# Patient Record
Sex: Female | Born: 1975
Health system: Southern US, Community
[De-identification: ages and names within clinical notes are randomized; demographics above are authoritative.]

## PROBLEM LIST (undated history)

## (undated) DIAGNOSIS — G4700 Insomnia, unspecified: Secondary | ICD-10-CM

## (undated) DIAGNOSIS — E282 Polycystic ovarian syndrome: Secondary | ICD-10-CM

## (undated) DIAGNOSIS — F419 Anxiety disorder, unspecified: Secondary | ICD-10-CM

## (undated) DIAGNOSIS — E559 Vitamin D deficiency, unspecified: Secondary | ICD-10-CM

## (undated) DIAGNOSIS — J309 Allergic rhinitis, unspecified: Secondary | ICD-10-CM

## (undated) DIAGNOSIS — R51 Headache: Secondary | ICD-10-CM

## (undated) DIAGNOSIS — J45909 Unspecified asthma, uncomplicated: Secondary | ICD-10-CM

## (undated) HISTORY — DX: Polycystic ovarian syndrome: E28.2

## (undated) HISTORY — DX: Allergic rhinitis, unspecified: J30.9

## (undated) HISTORY — DX: Vitamin D deficiency, unspecified: E55.9

## (undated) HISTORY — DX: Unspecified asthma, uncomplicated: J45.909

## (undated) HISTORY — DX: Anxiety disorder, unspecified: F41.9

## (undated) HISTORY — DX: Headache: R51

## (undated) HISTORY — DX: Insomnia, unspecified: G47.00

---

## 2010-04-27 ENCOUNTER — Ambulatory Visit: Payer: Self-pay | Admitting: Family Medicine

## 2010-04-27 DIAGNOSIS — R519 Headache, unspecified: Secondary | ICD-10-CM | POA: Insufficient documentation

## 2010-04-27 DIAGNOSIS — J309 Allergic rhinitis, unspecified: Secondary | ICD-10-CM | POA: Insufficient documentation

## 2010-04-27 DIAGNOSIS — G4700 Insomnia, unspecified: Secondary | ICD-10-CM

## 2010-04-27 DIAGNOSIS — R51 Headache: Secondary | ICD-10-CM

## 2010-04-27 DIAGNOSIS — J45909 Unspecified asthma, uncomplicated: Secondary | ICD-10-CM | POA: Insufficient documentation

## 2010-04-27 HISTORY — DX: Allergic rhinitis, unspecified: J30.9

## 2010-04-27 HISTORY — DX: Insomnia, unspecified: G47.00

## 2010-04-27 HISTORY — DX: Headache: R51

## 2010-10-09 ENCOUNTER — Ambulatory Visit
Admission: RE | Admit: 2010-10-09 | Discharge: 2010-10-09 | Payer: Self-pay | Source: Home / Self Care | Attending: Family Medicine | Admitting: Family Medicine

## 2010-10-09 ENCOUNTER — Other Ambulatory Visit: Payer: Self-pay | Admitting: Family Medicine

## 2010-10-09 LAB — CONVERTED CEMR LAB
Bilirubin Urine: NEGATIVE
Blood in Urine, dipstick: NEGATIVE
Glucose, Urine, Semiquant: NEGATIVE
Nitrite: NEGATIVE
Protein, U semiquant: NEGATIVE
Specific Gravity, Urine: 1.02
Urobilinogen, UA: 0.2
WBC Urine, dipstick: NEGATIVE
pH: 7

## 2010-10-09 LAB — CBC WITH DIFFERENTIAL/PLATELET
Basophils Absolute: 0 10*3/uL (ref 0.0–0.1)
Basophils Relative: 0.9 % (ref 0.0–3.0)
Eosinophils Absolute: 0.1 10*3/uL (ref 0.0–0.7)
Eosinophils Relative: 1.6 % (ref 0.0–5.0)
HCT: 35 % — ABNORMAL LOW (ref 36.0–46.0)
Hemoglobin: 12.2 g/dL (ref 12.0–15.0)
Lymphocytes Relative: 39.4 % (ref 12.0–46.0)
Lymphs Abs: 1.7 10*3/uL (ref 0.7–4.0)
MCHC: 34.7 g/dL (ref 30.0–36.0)
MCV: 89.4 fl (ref 78.0–100.0)
Monocytes Absolute: 0.3 10*3/uL (ref 0.1–1.0)
Monocytes Relative: 6.1 % (ref 3.0–12.0)
Neutro Abs: 2.3 10*3/uL (ref 1.4–7.7)
Neutrophils Relative %: 52 % (ref 43.0–77.0)
Platelets: 288 10*3/uL (ref 150.0–400.0)
RBC: 3.92 Mil/uL (ref 3.87–5.11)
RDW: 13.3 % (ref 11.5–14.6)
WBC: 4.3 10*3/uL — ABNORMAL LOW (ref 4.5–10.5)

## 2010-10-09 LAB — BASIC METABOLIC PANEL
BUN: 7 mg/dL (ref 6–23)
CO2: 28 mEq/L (ref 19–32)
Calcium: 9.1 mg/dL (ref 8.4–10.5)
Chloride: 106 mEq/L (ref 96–112)
Creatinine, Ser: 0.6 mg/dL (ref 0.4–1.2)
GFR: 111.09 mL/min (ref >60.00)
Glucose, Bld: 79 mg/dL (ref 70–99)
Potassium: 4.6 mEq/L (ref 3.5–5.1)
Sodium: 140 mEq/L (ref 135–145)

## 2010-10-09 LAB — LDL CHOLESTEROL, DIRECT: Direct LDL: 129.8 mg/dL

## 2010-10-09 LAB — LIPID PANEL
Cholesterol: 205 mg/dL — ABNORMAL HIGH (ref 0–200)
HDL: 67.1 mg/dL (ref >39.00)
Total CHOL/HDL Ratio: 3
Triglycerides: 41 mg/dL (ref 0.0–149.0)
VLDL: 8.2 mg/dL (ref 0.0–40.0)

## 2010-10-09 LAB — HEPATIC FUNCTION PANEL
ALT: 14 U/L (ref 0–35)
AST: 16 U/L (ref 0–37)
Albumin: 3.9 g/dL (ref 3.5–5.2)
Alkaline Phosphatase: 39 U/L (ref 39–117)
Bilirubin, Direct: 0.1 mg/dL (ref 0.0–0.3)
Total Bilirubin: 0.5 mg/dL (ref 0.3–1.2)
Total Protein: 6.4 g/dL (ref 6.0–8.3)

## 2010-10-09 LAB — TSH: TSH: 1.19 u[IU]/mL (ref 0.35–5.50)

## 2010-10-09 NOTE — Assessment & Plan Note (Signed)
Summary: NEW PT EST // RS   Vital Signs:  Patient profile:   35 year old female Menstrual status:  irregular LMP:     03/27/2010 Height:      66.25 inches Weight:      221 pounds BMI:     35.53 Temp:     99.0 degrees F oral Pulse rate:   88 / minute Pulse rhythm:   regular Resp:     12 per minute BP sitting:   100 / 68  (left arm) Cuff size:   large  Vitals Entered By: Sid Falcon LPN (April 27, 2010 3:15 PM)  Nutrition Counseling: Patient's BMI is greater than 25 and therefore counseled on weight management options. CC: New to establish LMP (date): 03/27/2010     Menstrual Status irregular Enter LMP: 03/27/2010   History of Present Illness: New patient to establish care. Has history of migraine headaches, mild intermittent asthma, seasonal allergies and obesity. Has previously been on phentermine but not taking currently. Long history of some intermittent insomnia. Takes alprazolam as needed. No prior surgeries. No known drug allergies.  Family history significant for hypertension, hyperlipidemia in both parents. He had an uncle with diabetes.  Patient works as area Production designer, theatre/television/film for [employer]. Ex-smoker. No alcohol. Married. No children.  Preventive Screening-Counseling & Management  Alcohol-Tobacco     Smoking Status: quit     Year Quit: 1991     Pack years: 1998  Allergies (verified): No Known Drug Allergies  Past History:  Family History: Last updated: 04/27/2010 Family History of Arthritis Mothers sister, breast CA > 50 Mom, hyperlipidemia, hypertension Dad, hyperlidemia, hypertension Both grandmothers, diabetes, type ll  Social History: Last updated: 04/27/2010 Occupation:  Social worker Married Alcohol use-no Past smoker, quit 2006  Risk Factors: Smoking Status: quit (04/27/2010)  Past Medical History: Asthma, mild intermittent Headache, Migraines Hay fever, allergies Chicken pox Chronic insomnia PMH-FH-SH reviewed for  relevance  Family History: Family History of Arthritis Mothers sister, breast CA > 50 Mom, hyperlipidemia, hypertension Dad, hyperlidemia, hypertension Both grandmothers, diabetes, type ll  Social History: Occupation:  Social worker Married Alcohol use-no Past smoker, quit 2006 Smoking Status:  quit Occupation:  employed  Review of Systems  The patient denies anorexia, fever, weight loss, weight gain, chest pain, syncope, dyspnea on exertion, peripheral edema, prolonged cough, headaches, hemoptysis, abdominal pain, melena, hematochezia, severe indigestion/heartburn, muscle weakness, and suspicious skin lesions.    Physical Exam  General:  Well-developed,well-nourished,in no acute distress; alert,appropriate and cooperative throughout examination Head:  Normocephalic and atraumatic without obvious abnormalities. No apparent alopecia or balding. Ears:  External ear exam shows no significant lesions or deformities.  Otoscopic examination reveals clear canals, tympanic membranes are intact bilaterally without bulging, retraction, inflammation or discharge. Hearing is grossly normal bilaterally. Mouth:  Oral mucosa and oropharynx without lesions or exudates.  Teeth in good repair. Neck:  No deformities, masses, or tenderness noted. Lungs:  Normal respiratory effort, chest expands symmetrically. Lungs are clear to auscultation, no crackles or wheezes. Heart:  Normal rate and regular rhythm. S1 and S2 normal without gallop, murmur, click, rub or other extra sounds. Extremities:  No clubbing, cyanosis, edema, or deformity noted with normal full range of motion of all joints.   Neurologic:  alert & oriented X3 and cranial nerves II-XII intact.   Cervical Nodes:  No lymphadenopathy noted Psych:  normally interactive, good eye contact, not anxious appearing, and not depressed appearing.     Impression & Recommendations:  Problem #  1:  ASTHMA (ICD-493.90) mild and intermittent by hx.  Uses  albuterol as needed   Problem # 2:  HEADACHE (ICD-784.0) hx of reported migraine, stable.  Problem # 3:  INSOMNIA, CHRONIC (ICD-307.42) use alprazolam sparingly.  Problem # 4:  ALLERGIC RHINITIS (ICD-477.9)  Problem # 5:  OBESITY (ICD-278.00) pt to schedule CPE and discuss further then.  Complete Medication List: 1)  Alprazolam 0.5 Mg Tabs (Alprazolam) .... 2 tabs as needed at bedtime  Patient Instructions: 1)  Schedule complete physical examination Prescriptions: ALPRAZOLAM 0.5 MG TABS (ALPRAZOLAM) 2 tabs as needed at bedtime  #60 x 1   Entered and Authorized by:   Evelena Peat MD   Signed by:   Evelena Peat MD on 04/27/2010   Method used:   Print then Give to Patient   RxID:   [id number]

## 2010-10-11 ENCOUNTER — Encounter: Payer: Self-pay | Admitting: Family Medicine

## 2010-10-12 ENCOUNTER — Ambulatory Visit (INDEPENDENT_AMBULATORY_CARE_PROVIDER_SITE_OTHER): Payer: BC Managed Care – HMO | Admitting: Family Medicine

## 2010-10-12 ENCOUNTER — Encounter: Payer: Self-pay | Admitting: Family Medicine

## 2010-10-12 VITALS — BP 120/78 | HR 72 | Temp 98.1°F | Resp 14 | Ht 66.0 in | Wt 225.0 lb

## 2010-10-12 DIAGNOSIS — Z23 Encounter for immunization: Secondary | ICD-10-CM

## 2010-10-12 DIAGNOSIS — Z0000 Encounter for general adult medical examination without abnormal findings: Secondary | ICD-10-CM

## 2010-10-12 NOTE — Progress Notes (Signed)
  Subjective:    Patient ID: Sarah Munoz, female    DOB: 04-Jan-1976, 35 y.o.   MRN: [id number]  HPI Patient presents for complete physical examination. She's been exercising with walking about 4 days per week or exercise cycle. Last tetanus unknown but she thinks about 10 years ago. Last Pap smear estimated one half years ago. She plans to set up see gynecologist later this year.  Past medical history, social history, and family history reviewed.  Patient struggled with weight gain through the years, especially after quitting smoking 2007. She's been on low-dose phentermine from another clinic.    Review of Systems  Constitutional: Negative for fever, activity change, appetite change and fatigue.  HENT: Negative for hearing loss, ear pain, sore throat and trouble swallowing.   Eyes: Negative for visual disturbance.  Respiratory: Negative for cough and shortness of breath.   Cardiovascular: Negative for chest pain and palpitations.  Gastrointestinal: Negative for abdominal pain, diarrhea, constipation and blood in stool.  Genitourinary: Negative for dysuria and hematuria.  Musculoskeletal: Negative for myalgias, back pain and arthralgias.  Skin: Negative for rash.  Neurological: Negative for dizziness, syncope and headaches.  Hematological: Negative for adenopathy.  Psychiatric/Behavioral: Negative for confusion and dysphoric mood.       Objective:   Physical Exam  Constitutional: She is oriented to person, place, and time. She appears well-developed and well-nourished. No distress.  HENT:  Head: Normocephalic and atraumatic.  Right Ear: External ear normal.  Left Ear: External ear normal.  Nose: Nose normal.  Mouth/Throat: Oropharynx is clear and moist. No oropharyngeal exudate.  Eyes: Conjunctivae and EOM are normal. Pupils are equal, round, and reactive to light.  Neck: Normal range of motion. Neck supple. No thyromegaly present.  Cardiovascular: Normal rate, regular  rhythm and normal heart sounds.  Exam reveals no gallop and no friction rub.   No murmur heard. Pulmonary/Chest: Effort normal and breath sounds normal. She has no wheezes. She has no rales.  Abdominal: Soft. Bowel sounds are normal. She exhibits no mass. There is no tenderness. There is no rebound and no guarding.  Musculoskeletal: Normal range of motion. She exhibits no edema.  Lymphadenopathy:    She has no cervical adenopathy.  Neurological: She is alert and oriented to person, place, and time. No cranial nerve deficit.  Skin: No rash noted. No erythema.  Psychiatric: She has a normal mood and affect.          Assessment & Plan:  Patient here for a wellness exam. Generally healthy. Minimally elevated cholesterol otherwise labs obtain Tdap given.  Discussed importance of exercise and weight loss. She will be scheduling Pap smear with gynecologist later this year She had questions about whether she should remain on oral contraception. Given her age and positive family history of DVT in mother we've recommended against.

## 2010-10-12 NOTE — Patient Instructions (Addendum)
Continue with regular exercise. Work on weight loss. Consider repeat pap smear within the next year. Your tetanus vaccine is good for ten years.Low-Fat, Low-Saturated-Fat, Low-Cholesterol Diets Food Selection Guide   BREADS, CEREALS, PASTA, RICE, DRIED PEAS, AND BEANS These products are high in carbohydrate, and most are low in fat. Therefore, they can be increased in the diet as substitutes for fatty foods. However, they, too, contain calories and should not be eaten in excess. Cereals can be eaten for snacks as well as for breakfast.    Include foods that contain fiber (fruits, vegetables, whole grains, and legumes). Research shows that fiber may lower blood cholesterol levels, especially the water-soluble fiber found in fruits, vegetables, oat products and legumes.   FRUITS AND VEGETABLES It is good to eat fruits and vegetables. Besides being sources of fiber, both are rich in vitamins and some minerals. They help you get the daily allowances of these nutrients. Fruits and vegetables can be used for snacks and desserts.   MEATS Limit how much lean meat, chicken, [place], and fish to no more than six ounces per day.   Beef, Pork, and Lamb  Use lean cuts of beef, pork, and lamb. Lean cuts include: l Extra-lean ground beef. l Arm roast. l Sirloin tip. l Center-cut ham. l Round steak. l Loin chops. l Rump roast. l Tenderloin.  Trim all fat off the outside of meats before cooking. It is not necessary to severely decrease the intake of red meat, but lean choices should be made. Lean meat is rich in protein, and contains a highly absorbable form of iron. Premenopausal women, in particular, should avoid severe reduction of lean red meat because this could increase the risk for low red blood cells (iron-deficiency anemia).   Processed Meats Processed meats such as bacon, bologna, salami, sausage and hot dogs contain large quantities of fat, are  not rich in valuable nutrients, and should  should not be eaten very often.   Organ Meats The organ meats such as liver, sweetbreads, kidneys, and brain are very rich in cholesterol. They should be limited.   Chicken and [place] These are good sources of protein. The fat of poultry can be reduced by removing the skin and underlying fat layers before cooking. Chicken and [place] can be substituted for lean red meat in the diet. Poultry should not be fried or covered with high-fat sauces.   Fish and Shellfish Fish are good sources of protein. Shellfish contain cholesterol, but they usually are low in saturated fatty acids. The preparation of fish is important. Like chicken and [place], they should not be fried or covered with fat-rich sauces.   EGGS Egg yolks often are hidden in cooked and processed foods. Egg whites contain no fat or cholesterol. They can be eaten often. Try one to two egg whites instead of whole eggs in recipes, or use egg substitutes that do not contain yolk.   MILK AND DAIRY PRODUCTS Use skim or 1% milk instead of 2% or whole milk. Decrease whole milk, natural and processed cheeses. Use nonfat or low-fat (2%) cottage cheese or low-fat cheeses made from vegetable oils. Choose the nonfat or low-fat (1-2%) yogurt. Experiment with evaporated skim milk in recipes that call for heavy cream. Substitute low-fat yogurt or low-fat cottage cheese for sour cream in dips and salad dressings. Have at least two servings of low-fat dairy products, such as two glasses of skim (or 1%) milk each day to help get your daily calcium intake.   FATS  AND OILS The general rule is to reduce the total intake of fats, especially saturated fat. Butter fat, lard and beef fats are high in saturated fat and cholesterol. These should be avoided as much as possible. Vegetable fats do not contain cholesterol, but certain vegetable fats such as coconut oil, palm oil, and palm kernel oil are very high in saturated fats. These should be limited. These fats are  often used in [employer], processed foods, popcorn, oils and non-dairy creamers. Vegetable shortenings and some peanut butters contain hydrogenated oils, which are also saturated fats. Read the labels on these foods to check for the use of saturated vegetable oils.   Unsaturated vegetable oils and fats do not raise blood cholesterol. However, they should be limited because they are fats and are high in calories. Total fat should still be limited to 30% of daily caloric intake. Desirable liquid vegetable oils are corn oil, cottonseed oil, olive oil, canola oil, safflower oil, soybean oil and sunflower oil. Peanut oil is not as good, but small amounts are acceptable. Buy a heart healthy tub margarine that has no partially hydrogenated oils in the ingredients. Mayonnaise and salad dressings often are made from unsaturated fats, but they should also be limited because of their high calorie and fat content.   Seeds, nuts, peanut butter, olives, and avocados are high in fat, but the fat is mainly the unsaturated type. These foods should be limited mainly to avoid excess calories and fat.   OTHER EATING TIPS Snacks  Most sweets should be limited as snacks. They tend to be rich in calories and fats, and their caloric content outweighs their nutritional value. Some good choices in snacks are graham crackers, Ry-Krisp crackers, melba toast, soda crackers, bagels (non-egg), English muffins, fruits and vegetables. These snacks are preferable to snack crackers, [place] fries and chips. Popcorn should be air-popped or cooked in small amounts of liquid vegetable oil.   Desserts Eat fruit, low-fat yogurt and fruit ices instead of pastries, cake and cookies. Also acceptable are sherbet, angel food cake, JelI~O, frozen low-fat yogurt, or other frozen products that do not contain saturated fat (such as pure fruit juice bars or popsicles).    COOKING METHODS Choose those methods that use little or no fat. They  include  Poaching.  Braising.  Steaming.  Grilling.  Baking.  Stir-frying.  Broiling.  Microwaving.   Foods can be cooked in a nonstick pan without added fat, or use a nonfat cooking spray in regular cookware. Limit fried foods and avoid frying in saturated fat. Add moisture to lean meats by using water, broth, cooking wines and other nonfat or low-fat sauces along with the cooking methods mentioned above.   Soups and stews should be chilled after cooking, and the fat that forms on top after a few hours in the refrigerator should be skimmed off. When preparing meals, avoid using excess salt. Salt can contribute to raising blood pressure in some people.   EATING AWAY FROM HOME Order entres, potatoes, and vegetables without sauces or butter. When meat exceeds the size of a deck of cards (three to four ounces), the rest can be taken home for another meal.   Choose vegetable or fruit salads, and ask for low-calorie salad dressings to be served on the side. Use dressings sparingly. Limit high-fat toppings such as bacon, crumbled eggs, cheese, sunflower seeds and olives. Ask for heart healthy tub margarine instead of butter.     Document Released: 02/15/2002  Document Re-Released: 09/17/2009  Exit[employer] Patient Information 2011 [place].

## 2010-10-29 ENCOUNTER — Encounter: Payer: Self-pay | Admitting: Family Medicine

## 2010-11-20 ENCOUNTER — Telehealth: Payer: Self-pay | Admitting: *Deleted

## 2010-11-20 MED ORDER — ALPRAZOLAM 0.5 MG PO TABS: ORAL_TABLET | ORAL | Status: DC

## 2010-11-20 NOTE — Telephone Encounter (Signed)
May modify to take 1-2 po qhs prn

## 2010-11-20 NOTE — Telephone Encounter (Signed)
Rx called in 

## 2010-11-20 NOTE — Telephone Encounter (Signed)
Faxed request to fill Alprazolam 0.5 mg tabs, sig is take 2 tabs at bedtime as needed.  Pt had CPX in Feb, 2012.  Our records have Alprazolam 0.5, one tab at HS as needed.

## 2011-05-03 ENCOUNTER — Ambulatory Visit (INDEPENDENT_AMBULATORY_CARE_PROVIDER_SITE_OTHER): Payer: BC Managed Care – HMO | Admitting: Family Medicine

## 2011-05-03 ENCOUNTER — Encounter: Payer: Self-pay | Admitting: Family Medicine

## 2011-05-03 VITALS — BP 104/70 | Temp 98.0°F | Wt 229.0 lb

## 2011-05-03 DIAGNOSIS — N63 Unspecified lump in unspecified breast: Secondary | ICD-10-CM

## 2011-05-03 DIAGNOSIS — N6320 Unspecified lump in the left breast, unspecified quadrant: Secondary | ICD-10-CM

## 2011-05-03 NOTE — Patient Instructions (Signed)
We will call you next week regarding breast studies. Continue to avoid caffeine.

## 2011-05-03 NOTE — Progress Notes (Signed)
  Subjective:    Patient ID: Sarah Munoz, female    DOB: Mar 14, 1976, 35 y.o.   MRN: [id number]  HPI Patient noted painful lump left breast around 11:00 position about 4 days ago. No injury. No nipple discharge. No history of fibrocystic changes. Generally avoids caffeine. Denies any adenopathy. No family history of breast cancer. Pain is relatively mild. No right breast symptoms. No change in size since onset. Last menstrual period about 2 weeks ago and normal. Denies headache.   Review of Systems  Constitutional: Negative for appetite change and unexpected weight change.  Skin: Negative for rash.  Hematological: Negative for adenopathy.       Objective:   Physical Exam  Constitutional: She appears well-developed and well-nourished. No distress.  Cardiovascular: Normal rate, regular rhythm and normal heart sounds.   Pulmonary/Chest: Effort normal and breath sounds normal. No respiratory distress. She has no wheezes. She has no rales.       Right breast is normal. No masses noted. Left breast reveals tender slightly mobile mass around 11:00 position upper medial quadrant. By palpation this is about 1 x 2 cm. Nipples appears normal. No skin changes. No axillary adenopathy          Assessment & Plan:  Painful left breast mass. Most likely this is benign but will proceed with diagnostic mammogram and ultrasound if indicated.

## 2011-05-06 ENCOUNTER — Other Ambulatory Visit: Payer: Self-pay | Admitting: Family Medicine

## 2011-05-06 DIAGNOSIS — N6320 Unspecified lump in the left breast, unspecified quadrant: Secondary | ICD-10-CM

## 2011-05-09 ENCOUNTER — Ambulatory Visit
Admission: RE | Admit: 2011-05-09 | Discharge: 2011-05-09 | Disposition: A | Discharge status: Home / Self Care | Payer: BC Managed Care – HMO | Source: Ambulatory Visit | Attending: Family Medicine | Admitting: Family Medicine

## 2011-05-09 DIAGNOSIS — N6320 Unspecified lump in the left breast, unspecified quadrant: Secondary | ICD-10-CM

## 2011-05-10 NOTE — Progress Notes (Signed)
Quick Note:  Pt informed on personally identified VM ______ 

## 2011-09-02 ENCOUNTER — Other Ambulatory Visit: Payer: Self-pay | Admitting: Family Medicine

## 2011-09-02 NOTE — Telephone Encounter (Signed)
Last filled on 11-20-10, #180 with 1 refill

## 2011-09-02 NOTE — Telephone Encounter (Signed)
Refill once 

## 2011-10-25 ENCOUNTER — Ambulatory Visit (INDEPENDENT_AMBULATORY_CARE_PROVIDER_SITE_OTHER): Payer: BC Managed Care – HMO | Admitting: Family Medicine

## 2011-10-25 ENCOUNTER — Encounter: Payer: Self-pay | Admitting: Family Medicine

## 2011-10-25 VITALS — BP 112/78 | Temp 98.1°F | Wt 250.0 lb

## 2011-10-25 DIAGNOSIS — J209 Acute bronchitis, unspecified: Secondary | ICD-10-CM

## 2011-10-25 MED ORDER — ALBUTEROL SULFATE HFA 108 (90 BASE) MCG/ACT IN AERS: 2.0000 | INHALATION_SPRAY | RESPIRATORY_TRACT | Status: DC | PRN

## 2011-10-25 MED ORDER — HYDROCOD POLST-CHLORPHEN POLST 10-8 MG/5ML PO LQCR: 5.0000 mL | Freq: Two times a day (BID) | ORAL | Status: DC | PRN

## 2011-10-25 NOTE — Patient Instructions (Signed)
Follow up immediately for any fever or worsening symptoms. 

## 2011-10-25 NOTE — Progress Notes (Signed)
  Subjective:    Patient ID: Sarah Munoz, female    DOB: 1975/12/14, 36 y.o.   MRN: [id number]  HPI  Acute visit. Onset of cough about one week ago. Cough is dry. She has history of asthma which is mild and intermittent. Uses albuterol as needed. Decreased sleep secondary to cough. Has some associated nasal congestion. No fever or chills. No dyspnea. Using over-the-counter NyQuil without any relief of cough. Denies sore throat.  Patient had intermittent headaches. Recently saw ophthalmologist. Question of pseudotumor cerebri and she is still undergoing workup   Review of Systems  Constitutional: Negative for fever and chills.  HENT: Positive for congestion.   Respiratory: Positive for cough. Negative for shortness of breath and wheezing.   Cardiovascular: Negative for chest pain.       Objective:   Physical Exam  Constitutional: She appears well-developed and well-nourished.  HENT:  Right Ear: External ear normal.  Left Ear: External ear normal.  Mouth/Throat: Oropharynx is clear and moist.  Neck: Neck supple. No thyromegaly present.  Cardiovascular: Normal rate and regular rhythm.   Pulmonary/Chest: Effort normal and breath sounds normal. No respiratory distress. She has no wheezes. She has no rales.  Lymphadenopathy:    She has no cervical adenopathy.          Assessment & Plan:  Cough probably secondary to acute viral bronchitis. Tussionex 1 teaspoon every 12 hours when necessary cough. Follow promptly for fever. Refill albuterol for as needed use

## 2011-12-11 ENCOUNTER — Telehealth: Payer: Self-pay | Admitting: Family Medicine

## 2011-12-11 NOTE — Telephone Encounter (Signed)
Left a message for return call.  

## 2011-12-11 NOTE — Telephone Encounter (Signed)
Pt called and has a ?ear inf and also is having pain in her jaw. No chest pain. Req work in [employer].

## 2011-12-11 NOTE — Telephone Encounter (Signed)
Appt made for tomorrow. Patient was ok with that, stated that if got really bad, she'd call back or go to an UC.

## 2011-12-12 ENCOUNTER — Encounter: Payer: Self-pay | Admitting: Family Medicine

## 2011-12-12 ENCOUNTER — Ambulatory Visit (INDEPENDENT_AMBULATORY_CARE_PROVIDER_SITE_OTHER): Payer: BC Managed Care – HMO | Admitting: Family Medicine

## 2011-12-12 VITALS — BP 110/80 | Temp 98.6°F | Wt 250.0 lb

## 2011-12-12 DIAGNOSIS — M26629 Arthralgia of temporomandibular joint, unspecified side: Secondary | ICD-10-CM

## 2011-12-12 NOTE — Patient Instructions (Signed)
Temporomandibular Problems   Temporomandibular joint (TMJ) dysfunction means there are problems with the joint between your jaw and your skull. This is a joint lined by cartilage like other joints in your body but also has a small disc in the joint which keeps the bones from rubbing on each other. These joints are like other joints and can get inflamed (sore) from arthritis and other problems. When this joint gets sore, it can cause headaches and pain in the jaw and the face.   CAUSES   Usually the arthritic types of problems are caused by soreness in the joint. Soreness in the joint can also be caused by overuse. This may come from grinding your teeth. It may also come from mis-alignment in the joint.   DIAGNOSIS   Diagnosis of this condition can often be made by history and exam. Sometimes your caregiver may need X-rays or an MRI scan to determine the exact cause. It may be necessary to see your dentist to determine if your teeth and jaws are lined up correctly.   TREATMENT   Most of the time this problem is not serious; however, sometimes it can persist (become chronic). When this happens medications that will cut down on inflammation (soreness) help. Sometimes a shot of cortisone into the joint will be helpful. If your teeth are not aligned it may help for your dentist to make a splint for your mouth that can help this problem. If no physical problems can be found, the problem may come from tension. If tension is found to be the cause, biofeedback or relaxation techniques may be helpful.   HOME CARE INSTRUCTIONS   Later in the day, applications of ice packs may be helpful. Ice can be used in a plastic bag with a towel around it to prevent frostbite to skin. This may be used about every 2 hours for 20 to 30 minutes, as needed while awake, or as directed by your caregiver.   Only take over-the-counter or prescription medicines for pain, discomfort, or fever as directed by your caregiver.   If physical therapy was  prescribed, follow your caregiver's directions.   Wear mouth appliances as directed if they were given.   Document Released: 05/21/2001 Document Revised: 08/15/2011 Document Reviewed: 08/28/2008   Exit[employer]® Patient Information ©2012 [employer].

## 2011-12-12 NOTE — Progress Notes (Signed)
  Subjective:    Patient ID: Sarah Munoz, female    DOB: 20-Jun-1976, 36 y.o.   MRN: [id number]  HPI  Left facial pain one day duration. No edema. Location is TMJ region. No ear pain. No hearing changes. Denies sore throat. No facial rash. Pain is worse with chewing and yawning. Minimally fatigued. Has not tried any ice. No prior history of TMJ problems. No facial weakness.   Review of Systems  Constitutional: Negative for fever and chills.  HENT: Negative for sore throat.   Skin: Negative for rash.  Hematological: Negative for adenopathy.       Objective:   Physical Exam  Constitutional: She appears well-developed and well-nourished. No distress.  HENT:  Right Ear: External ear normal.  Left Ear: External ear normal.  Mouth/Throat: Oropharynx is clear and moist.       Patient has tenderness to palpation left TMJ region. No parotid swelling. No facial rash. No warmth or erythema.  Neck: Neck supple.  Lymphadenopathy:    She has no cervical adenopathy.          Assessment & Plan:  Probable left TMJ syndrome. Avoid hard to chew foods. Try over-the-counter nonsteroidals such as Aleve. Consider ice for symptomatic relief. Consider mouth appliance to avoid bruxism. Consider referral to oral surgeon if no better in 2-3 weeks

## 2012-02-07 ENCOUNTER — Other Ambulatory Visit: Payer: Self-pay | Admitting: *Deleted

## 2012-02-07 MED ORDER — ALPRAZOLAM 0.5 MG PO TABS: ORAL_TABLET | ORAL | Status: DC

## 2012-07-31 ENCOUNTER — Other Ambulatory Visit (INDEPENDENT_AMBULATORY_CARE_PROVIDER_SITE_OTHER): Payer: BC Managed Care – HMO

## 2012-07-31 DIAGNOSIS — Z0000 Encounter for general adult medical examination without abnormal findings: Secondary | ICD-10-CM

## 2012-07-31 LAB — POCT URINALYSIS DIPSTICK
Bilirubin, UA: NEGATIVE
Blood, UA: NEGATIVE
Glucose, UA: NEGATIVE
Ketones, UA: NEGATIVE
Leukocytes, UA: NEGATIVE
Nitrite, UA: NEGATIVE
Protein, UA: NEGATIVE
Spec Grav, UA: 1.02
Urobilinogen, UA: 0.2
pH, UA: 8.5

## 2012-07-31 LAB — HEPATIC FUNCTION PANEL
ALT: 12 U/L (ref 0–35)
AST: 17 U/L (ref 0–37)
Albumin: 3.6 g/dL (ref 3.5–5.2)
Alkaline Phosphatase: 40 U/L (ref 39–117)
Bilirubin, Direct: 0.1 mg/dL (ref 0.0–0.3)
Total Bilirubin: 0.5 mg/dL (ref 0.3–1.2)
Total Protein: 6.8 g/dL (ref 6.0–8.3)

## 2012-07-31 LAB — LIPID PANEL
Cholesterol: 183 mg/dL (ref 0–200)
HDL: 66.7 mg/dL (ref >39.00)
LDL Cholesterol: 104 mg/dL — ABNORMAL HIGH (ref 0–99)
Total CHOL/HDL Ratio: 3
Triglycerides: 62 mg/dL (ref 0.0–149.0)
VLDL: 12.4 mg/dL (ref 0.0–40.0)

## 2012-07-31 LAB — CBC WITH DIFFERENTIAL/PLATELET
Basophils Absolute: 0 10*3/uL (ref 0.0–0.1)
Basophils Relative: 0.8 % (ref 0.0–3.0)
Eosinophils Absolute: 0 10*3/uL (ref 0.0–0.7)
Eosinophils Relative: 0.6 % (ref 0.0–5.0)
HCT: 39.1 % (ref 36.0–46.0)
Hemoglobin: 13.1 g/dL (ref 12.0–15.0)
Lymphocytes Relative: 28.6 % (ref 12.0–46.0)
Lymphs Abs: 1.9 10*3/uL (ref 0.7–4.0)
MCHC: 33.6 g/dL (ref 30.0–36.0)
MCV: 86.2 fl (ref 78.0–100.0)
Monocytes Absolute: 0.5 10*3/uL (ref 0.1–1.0)
Monocytes Relative: 7.1 % (ref 3.0–12.0)
Neutro Abs: 4.1 10*3/uL (ref 1.4–7.7)
Neutrophils Relative %: 62.9 % (ref 43.0–77.0)
Platelets: 242 10*3/uL (ref 150.0–400.0)
RBC: 4.53 Mil/uL (ref 3.87–5.11)
RDW: 12.9 % (ref 11.5–14.6)
WBC: 6.6 10*3/uL (ref 4.5–10.5)

## 2012-07-31 LAB — BASIC METABOLIC PANEL
BUN: 8 mg/dL (ref 6–23)
CO2: 27 mEq/L (ref 19–32)
Calcium: 8.9 mg/dL (ref 8.4–10.5)
Chloride: 104 mEq/L (ref 96–112)
Creatinine, Ser: 0.6 mg/dL (ref 0.4–1.2)
GFR: 131.04 mL/min (ref >60.00)
Glucose, Bld: 94 mg/dL (ref 70–99)
Potassium: 4.5 mEq/L (ref 3.5–5.1)
Sodium: 137 mEq/L (ref 135–145)

## 2012-07-31 LAB — TSH: TSH: 1.12 u[IU]/mL (ref 0.35–5.50)

## 2012-08-21 ENCOUNTER — Encounter: Payer: BC Managed Care – HMO | Admitting: Family Medicine

## 2012-08-31 ENCOUNTER — Ambulatory Visit (INDEPENDENT_AMBULATORY_CARE_PROVIDER_SITE_OTHER): Payer: BC Managed Care – HMO | Admitting: Family Medicine

## 2012-08-31 ENCOUNTER — Encounter: Payer: Self-pay | Admitting: Family Medicine

## 2012-08-31 VITALS — BP 98/80 | HR 100 | Temp 98.0°F | Resp 12 | Ht 66.75 in | Wt 281.0 lb

## 2012-08-31 DIAGNOSIS — Z0000 Encounter for general adult medical examination without abnormal findings: Secondary | ICD-10-CM

## 2012-08-31 MED ORDER — ALPRAZOLAM 0.5 MG PO TABS: 0.5000 mg | ORAL_TABLET | Freq: Every evening | ORAL | Status: DC | PRN

## 2012-08-31 NOTE — Patient Instructions (Addendum)
Insomnia Insomnia is frequent trouble falling and/or staying asleep. Insomnia can be a long term problem or a short term problem. Both are common. Insomnia can be a short term problem when the wakefulness is related to a certain stress or worry. Long term insomnia is often related to ongoing stress during waking hours and/or poor sleeping habits. Overtime, sleep deprivation itself can make the problem worse. Every little thing feels more severe because you are overtired and your ability to cope is decreased. CAUSES   Stress, anxiety, and depression.  Poor sleeping habits.  Distractions such as TV in the bedroom.  Naps close to bedtime.  Engaging in emotionally charged conversations before bed.  Technical reading before sleep.  Alcohol and other sedatives. They may make the problem worse. They can hurt normal sleep patterns and normal dream activity.  Stimulants such as caffeine for several hours prior to bedtime.  Pain syndromes and shortness of breath can cause insomnia.  Exercise late at night.  Changing time zones may cause sleeping problems (jet lag). It is sometimes helpful to have someone observe your sleeping patterns. They should look for periods of not breathing during the night (sleep apnea). They should also look to see how long those periods last. If you live alone or observers are uncertain, you can also be observed at a sleep clinic where your sleep patterns will be professionally monitored. Sleep apnea requires a checkup and treatment. Give your caregivers your medical history. Give your caregivers observations your family has made about your sleep.  SYMPTOMS   Not feeling rested in the morning.  Anxiety and restlessness at bedtime.  Difficulty falling and staying asleep. TREATMENT   Your caregiver may prescribe treatment for an underlying medical disorders. Your caregiver can give advice or help if you are using alcohol or other drugs for self-medication. Treatment  of underlying problems will usually eliminate insomnia problems.  Medications can be prescribed for short time use. They are generally not recommended for lengthy use.  Over-the-counter sleep medicines are not recommended for lengthy use. They can be habit forming.  You can promote easier sleeping by making lifestyle changes such as:  Using relaxation techniques that help with breathing and reduce muscle tension.  Exercising earlier in the day.  Changing your diet and the time of your last meal. No night time snacks.  Establish a regular time to go to bed.  Counseling can help with stressful problems and worry.  Soothing music and white noise may be helpful if there are background noises you cannot remove.  Stop tedious detailed work at least one hour before bedtime. HOME CARE INSTRUCTIONS   Keep a diary. Inform your caregiver about your progress. This includes any medication side effects. See your caregiver regularly. Take note of:  Times when you are asleep.  Times when you are awake during the night.  The quality of your sleep.  How you feel the next day. This information will help your caregiver care for you.  Get out of bed if you are still awake after 15 minutes. Read or do some quiet activity. Keep the lights down. Wait until you feel sleepy and go back to bed.  Keep regular sleeping and waking hours. Avoid naps.  Exercise regularly.  Avoid distractions at bedtime. Distractions include watching television or engaging in any intense or detailed activity like attempting to balance the household checkbook.  Develop a bedtime ritual. Keep a familiar routine of bathing, brushing your teeth, climbing into bed at the same   time each night, listening to soothing music. Routines increase the success of falling to sleep faster.  Use relaxation techniques. This can be using breathing and muscle tension release routines. It can also include visualizing peaceful scenes. You can  also help control troubling or intruding thoughts by keeping your mind occupied with boring or repetitive thoughts like the old concept of counting sheep. You can make it more creative like imagining planting one beautiful flower after another in your backyard garden.  During your day, work to eliminate stress. When this is not possible use some of the previous suggestions to help reduce the anxiety that accompanies stressful situations. MAKE SURE YOU:   Understand these instructions.  Will watch your condition.  Will get help right away if you are not doing well or get worse. Document Released: 08/23/2000 Document Revised: 11/18/2011 Document Reviewed: 09/23/2007 [facility] Patient Information 2013 [place], [place].  OB-GYNs: Dr Zelphia Cairo      [phone] Dr Richarda Overlie        [phone] Dr Carmelina Peal      [phone]

## 2012-08-31 NOTE — Progress Notes (Signed)
  Subjective:    Patient ID: Sarah Munoz, female    DOB: 03/23/1976, 36 y.o.   MRN: [id number]  HPI Patient here for wellness exam. She's had very stressful year with work stress and her father battling esophageal cancer. She's been overeating and gained about 30 some pounds. She recently started back exercising and having some success. She has already lost about 10 pounds. Last tetanus 2012. Long history of insomnia. Has used low-dose alprazolam. Denies depression symptoms. Requesting names of OB/GYN  Past Medical History  Diagnosis Date  . INSOMNIA, CHRONIC 04/27/2010  . ALLERGIC RHINITIS 04/27/2010  . Headache 04/27/2010   No past surgical history on file.  reports that she quit smoking about 7 years ago. She does not have any smokeless tobacco history on file. She reports that she does not drink alcohol or use illicit drugs. family history includes Cancer (age of onset:65) in her father; Deep vein thrombosis in her mother; Diabetes in her maternal grandmother and paternal grandmother; Hyperlipidemia in her father and mother; and Hypertension in her father and mother. No Known Allergies    Review of Systems  Constitutional: Positive for unexpected weight change. Negative for fever, activity change, appetite change and fatigue.  HENT: Negative for hearing loss, ear pain, sore throat and trouble swallowing.   Eyes: Negative for visual disturbance.  Respiratory: Negative for cough and shortness of breath.   Cardiovascular: Negative for chest pain and palpitations.  Gastrointestinal: Negative for abdominal pain, diarrhea, constipation and blood in stool.  Genitourinary: Negative for dysuria and hematuria.  Musculoskeletal: Negative for myalgias, back pain and arthralgias.  Skin: Negative for rash.  Neurological: Negative for dizziness, syncope and headaches.  Hematological: Negative for adenopathy.  Psychiatric/Behavioral: Negative for confusion and dysphoric mood.       Objective:    Physical Exam  Constitutional: She is oriented to person, place, and time. She appears well-developed and well-nourished.  HENT:  Right Ear: External ear normal.  Left Ear: External ear normal.  Mouth/Throat: Oropharynx is clear and moist.  Neck: Neck supple. No thyromegaly present.  Cardiovascular: Normal rate and regular rhythm.   No murmur heard. Pulmonary/Chest: Effort normal and breath sounds normal. No respiratory distress. She has no wheezes. She has no rales.  Abdominal: Soft. Bowel sounds are normal. She exhibits no distension and no mass. There is no tenderness. There is no rebound and no guarding.  Genitourinary:       Per gyn  Musculoskeletal: She exhibits no edema.  Lymphadenopathy:    She has no cervical adenopathy.  Neurological: She is alert and oriented to person, place, and time.  Skin: No rash noted.  Psychiatric: She has a normal mood and affect. Her behavior is normal.          Assessment & Plan:  Complete physical. Patient has morbid obesity and we talked at some length about importance of weight loss. Has already started exercise program addressing that. We will give her name a couple of local OB/GYN her request. Labs reviewed with patient with no major abnormalities. Sleep hygiene discussed

## 2013-03-24 ENCOUNTER — Encounter: Payer: Self-pay | Admitting: Family Medicine

## 2013-03-24 ENCOUNTER — Ambulatory Visit (INDEPENDENT_AMBULATORY_CARE_PROVIDER_SITE_OTHER): Payer: BC Managed Care – HMO | Admitting: Family Medicine

## 2013-03-24 VITALS — BP 106/72 | HR 86 | Temp 98.6°F | Wt 256.0 lb

## 2013-03-24 DIAGNOSIS — L819 Disorder of pigmentation, unspecified: Secondary | ICD-10-CM

## 2013-03-24 NOTE — Progress Notes (Signed)
  Subjective:    Patient ID: Sarah Munoz, female    DOB: 1975-12-09, 37 y.o.   MRN: [id number]  HPI Pigmented area right forearm States she's had a mole there for many years but possibly growing recently. No itching or bleeding. No past history of skin cancer and no family history. She is currently on weight loss programs lost about 25-30 pounds and overall doing very well. Has mild intermittent asthma and rarely takes albuterol. No other medications.  Past Medical History  Diagnosis Date  . INSOMNIA, CHRONIC 04/27/2010  . ALLERGIC RHINITIS 04/27/2010  . Headache(784.0) 04/27/2010   No past surgical history on file.  reports that she quit smoking about 8 years ago. She does not have any smokeless tobacco history on file. She reports that she does not drink alcohol or use illicit drugs. family history includes Cancer (age of onset: 46) in her father; Deep vein thrombosis in her mother; Diabetes in her maternal grandmother and paternal grandmother; Hyperlipidemia in her father and mother; and Hypertension in her father and mother. No Known Allergies    Review of Systems  Constitutional: Negative for appetite change and unexpected weight change.  Hematological: Negative for adenopathy.       Objective:   Physical Exam  Constitutional: She appears well-developed and well-nourished.  Cardiovascular: Normal rate and regular rhythm.   Pulmonary/Chest: Effort normal and breath sounds normal. No respiratory distress. She has no wheezes. She has no rales.  Skin:  Right mid forearm reveals 5 x 6 mm well demarcated symmetric smooth border brown homogenous lesion. Possibly slightly scaly surface.          Assessment & Plan:  Benign pigmented lesion right forearm. Suspect early seborrheic keratosis. Benign features. Reviewed ABCDs for concerning skin lesions and she will followup for any changes

## 2013-03-24 NOTE — Patient Instructions (Addendum)
Moles  Moles are usually harmless growths on the skin. They are accumulations of color (pigment) cells in the skin that:    Can be various colors, from light brown to black.   Can appear anywhere on the body.   May remain flat or become raised.   May contain hairs.   May remain smooth or develop wrinkling.  Most moles are not cancerous (benign). However, some moles may develop changes and become cancerous. It is important to check your moles every month. If you check your moles regularly, you will be able to notice any changes that may occur.   CAUSES   Moles occur when skin cells grow together in clusters instead of spreading out in the skin as they normally do. The reason for this clustering is unknown.  DIAGNOSIS   Your caregiver will perform a skin examination to diagnose your mole.   TREATMENT   Moles usually do not require treatment. If a mole becomes worrisome, your caregiver may choose to take a sample of the mole or remove it entirely, and then send it to a lab for examination.   HOME CARE INSTRUCTIONS   Check your mole(s) monthly for changes that may indicate skin cancer. These changes can include:   A change in size.   A change in color. Note that moles tend to darken during pregnancy or when taking birth control pills (oral contraception).   A change in shape.   A change in the border of the mole.   Wear sunscreen (with an SPF of at least 30) when you spend long periods of time outside. Reapply the sunscreen every 2 3 hours.   Schedule annual appointments with your skin doctor (dermatologist) if you have a large number of moles.  SEEK MEDICAL CARE IF:   Your mole changes size, especially if it becomes larger than a pencil eraser.   Your mole changes in color or develops more than one color.   Your mole becomes itchy or bleeds.   Your mole, or the skin near the mole, becomes painful, sore, red, or swollen.   Your mole becomes scaly, sheds skin, or oozes fluid.    Your mole develops irregular borders.   Your mole becomes flat or develops raised areas.   Your mole becomes hard or soft.  Document Released: 05/21/2001 Document Revised: 05/20/2012 Document Reviewed: 03/09/2012  [employer] Patient Information 2014 [employer].

## 2013-04-22 ENCOUNTER — Telehealth: Payer: Self-pay

## 2013-04-22 MED ORDER — ALPRAZOLAM 0.5 MG PO TABS: 0.5000 mg | ORAL_TABLET | Freq: Every evening | ORAL | Status: DC | PRN

## 2013-04-22 NOTE — Telephone Encounter (Signed)
rx phoned in

## 2013-04-22 NOTE — Telephone Encounter (Signed)
Refill for 3 months. 

## 2013-04-22 NOTE — Telephone Encounter (Signed)
Alprazolam 0.5mg  tab Take 1-2 tablets by mouth at bedtime as needed  Last refill 08/31/12 #60 3 refills Last visit 03/24/13  [facility]

## 2013-12-10 ENCOUNTER — Other Ambulatory Visit: Payer: Self-pay | Admitting: Family Medicine

## 2013-12-14 NOTE — Telephone Encounter (Signed)
Last refill 04/22/13 #60 2 refills Last visit 03/24/13

## 2013-12-15 NOTE — Telephone Encounter (Signed)
Refill with one additional refill. 

## 2014-08-15 ENCOUNTER — Telehealth: Payer: Self-pay

## 2014-08-15 NOTE — Telephone Encounter (Signed)
Rx request for ProAir HFT 90MCG/INH AER- Inhale 1 or 2 puffs by mouth every 4 to 6 hours as needed.  [facility]

## 2014-08-15 NOTE — Telephone Encounter (Signed)
Denied pt needs a office visit, has not been since 03/2013

## 2015-03-10 ENCOUNTER — Other Ambulatory Visit (INDEPENDENT_AMBULATORY_CARE_PROVIDER_SITE_OTHER): Payer: BLUE CROSS/BLUE SHIELD

## 2015-03-10 DIAGNOSIS — Z0000 Encounter for general adult medical examination without abnormal findings: Secondary | ICD-10-CM

## 2015-03-10 LAB — BASIC METABOLIC PANEL
BUN: 16 mg/dL (ref 6–23)
CO2: 26 mEq/L (ref 19–32)
Calcium: 9.1 mg/dL (ref 8.4–10.5)
Chloride: 105 mEq/L (ref 96–112)
Creatinine, Ser: 0.55 mg/dL (ref 0.40–1.20)
GFR: 129.32 mL/min (ref >60.00)
Glucose, Bld: 93 mg/dL (ref 70–99)
Potassium: 4.3 mEq/L (ref 3.5–5.1)
Sodium: 139 mEq/L (ref 135–145)

## 2015-03-10 LAB — CBC WITH DIFFERENTIAL/PLATELET
Basophils Absolute: 0 10*3/uL (ref 0.0–0.1)
Basophils Relative: 0.5 % (ref 0.0–3.0)
Eosinophils Absolute: 0.1 10*3/uL (ref 0.0–0.7)
Eosinophils Relative: 2.1 % (ref 0.0–5.0)
HCT: 39.3 % (ref 36.0–46.0)
Hemoglobin: 13.2 g/dL (ref 12.0–15.0)
Lymphocytes Relative: 34.3 % (ref 12.0–46.0)
Lymphs Abs: 2.2 10*3/uL (ref 0.7–4.0)
MCHC: 33.5 g/dL (ref 30.0–36.0)
MCV: 85.6 fl (ref 78.0–100.0)
Monocytes Absolute: 0.5 10*3/uL (ref 0.1–1.0)
Monocytes Relative: 8.3 % (ref 3.0–12.0)
Neutro Abs: 3.5 10*3/uL (ref 1.4–7.7)
Neutrophils Relative %: 54.8 % (ref 43.0–77.0)
Platelets: 347 10*3/uL (ref 150.0–400.0)
RBC: 4.6 Mil/uL (ref 3.87–5.11)
RDW: 14.5 % (ref 11.5–15.5)
WBC: 6.4 10*3/uL (ref 4.0–10.5)

## 2015-03-10 LAB — HEPATIC FUNCTION PANEL
ALT: 15 U/L (ref 0–35)
AST: 20 U/L (ref 0–37)
Albumin: 3.9 g/dL (ref 3.5–5.2)
Alkaline Phosphatase: 58 U/L (ref 39–117)
Bilirubin, Direct: 0.1 mg/dL (ref 0.0–0.3)
Total Bilirubin: 0.4 mg/dL (ref 0.2–1.2)
Total Protein: 6.9 g/dL (ref 6.0–8.3)

## 2015-03-10 LAB — LIPID PANEL
Cholesterol: 180 mg/dL (ref 0–200)
HDL: 67.1 mg/dL (ref >39.00)
LDL Cholesterol: 99 mg/dL (ref 0–99)
NonHDL: 112.9
Total CHOL/HDL Ratio: 3
Triglycerides: 70 mg/dL (ref 0.0–149.0)
VLDL: 14 mg/dL (ref 0.0–40.0)

## 2015-03-10 LAB — TSH: TSH: 1.99 u[IU]/mL (ref 0.35–4.50)

## 2015-03-17 ENCOUNTER — Encounter: Payer: Self-pay | Admitting: Family Medicine

## 2015-03-24 ENCOUNTER — Encounter: Payer: Self-pay | Admitting: Family Medicine

## 2015-03-24 ENCOUNTER — Ambulatory Visit (INDEPENDENT_AMBULATORY_CARE_PROVIDER_SITE_OTHER): Payer: BLUE CROSS/BLUE SHIELD | Admitting: Family Medicine

## 2015-03-24 VITALS — BP 116/88 | HR 98 | Temp 98.1°F | Ht 66.0 in | Wt 298.1 lb

## 2015-03-24 DIAGNOSIS — E282 Polycystic ovarian syndrome: Secondary | ICD-10-CM | POA: Insufficient documentation

## 2015-03-24 DIAGNOSIS — Z0000 Encounter for general adult medical examination without abnormal findings: Secondary | ICD-10-CM

## 2015-03-24 MED ORDER — ALPRAZOLAM 0.5 MG PO TABS: 0.5000 mg | ORAL_TABLET | Freq: Every evening | ORAL | Status: DC | PRN

## 2015-03-24 MED ORDER — ALBUTEROL SULFATE HFA 108 (90 BASE) MCG/ACT IN AERS: 2.0000 | INHALATION_SPRAY | RESPIRATORY_TRACT | Status: DC | PRN

## 2015-03-24 NOTE — Progress Notes (Signed)
Subjective:    Patient ID: Sarah Munoz, female    DOB: [birth year], 39 y.o.   MRN: [id number]  HPI Patient seen for complete physical. Her father died of cancer last year and this has been a particularly stressful year for her. She's been very discouraged that she has gained substantial weight over the past couple years in spite of trying to watch her overall calories.  She has tried Weight Watchers, low carbohydrate diets, and phentermine the past without much success. She was diagnosed with polycystic ovary syndrome by her GYN earlier this year. She still sees her gynecologist yearly.  She was placed by her GYN on fluoxetine which she takes just prior to her menstrual cycle for severe cramping. She has mild intermittent asthma and requesting refill albuterol.  Past Medical History  Diagnosis Date  . INSOMNIA, CHRONIC 04/27/2010  . ALLERGIC RHINITIS 04/27/2010  . Headache(784.0) 04/27/2010  . PCOS (polycystic ovarian syndrome)     diagnosed by Dr Everett Graff  . Vitamin D deficiency     diagnosed by Dr Everett Graff (OB/GYN)   No past surgical history on file.  reports that she quit smoking about 10 years ago. She does not have any smokeless tobacco history on file. She reports that she does not drink alcohol or use illicit drugs. family history includes Cancer (age of onset: 25) in her father; Deep vein thrombosis in her mother; Diabetes in her maternal grandmother and paternal grandmother; Hyperlipidemia in her father and mother; Hypertension in her father and mother. No Known Allergies    Review of Systems  Constitutional: Negative for fever, activity change, appetite change, fatigue and unexpected weight change.  HENT: Negative for ear pain, hearing loss, sore throat and trouble swallowing.   Eyes: Negative for visual disturbance.  Respiratory: Negative for cough and shortness of breath.   Cardiovascular: Negative for chest pain and palpitations.  Gastrointestinal: Negative  for abdominal pain, diarrhea, constipation and blood in stool.  Endocrine: Negative for polydipsia and polyuria.  Genitourinary: Negative for dysuria and hematuria.  Musculoskeletal: Negative for myalgias, back pain and arthralgias.  Skin: Negative for rash.  Neurological: Negative for dizziness, syncope and headaches.  Hematological: Negative for adenopathy.  Psychiatric/Behavioral: Negative for confusion and dysphoric mood.       Objective:   Physical Exam  Constitutional: She is oriented to person, place, and time. She appears well-developed and well-nourished.  HENT:  Head: Normocephalic and atraumatic.  Eyes: EOM are normal. Pupils are equal, round, and reactive to light.  Neck: Normal range of motion. Neck supple. No thyromegaly present.  Cardiovascular: Normal rate, regular rhythm and normal heart sounds.   No murmur heard. Pulmonary/Chest: Breath sounds normal. No respiratory distress. She has no wheezes. She has no rales.  Abdominal: Soft. Bowel sounds are normal. She exhibits no distension and no mass. There is no tenderness. There is no rebound and no guarding.  Genitourinary:  Per gyn  Musculoskeletal: Normal range of motion. She exhibits no edema.  Lymphadenopathy:    She has no cervical adenopathy.  Neurological: She is alert and oriented to person, place, and time. She displays normal reflexes. No cranial nerve deficit.  Skin: No rash noted.  Psychiatric: She has a normal mood and affect. Her behavior is normal. Judgment and thought content normal.          Assessment & Plan:  Complete physical. Labs reviewed. No major concerns. She has significant weight gain over the past few years. We talked at some length  about strategies for weight loss. We offered nutrition counseling but at this point she wishes to wait. She is exploring possibility of gastric bypass surgery but is undecided at this point and will let us know if she decides to pursue this.

## 2015-03-24 NOTE — Progress Notes (Signed)
Pre visit review using our clinic review tool, if applicable. No additional management support is needed unless otherwise documented below in the visit note. 

## 2016-01-23 ENCOUNTER — Other Ambulatory Visit: Payer: Self-pay | Admitting: *Deleted

## 2016-01-23 NOTE — Telephone Encounter (Signed)
Refill once only. She does not take regularly.  Needs office follow up by this July.

## 2016-01-23 NOTE — Telephone Encounter (Signed)
Last visit CPE 03/24/2015  Last refill #60, 1rf No pending appt Please advise

## 2016-01-23 NOTE — Telephone Encounter (Signed)
Received fax request from [facility] for refill on ALPRAZolam Duanne Moron) 0.5 MG tablet

## 2016-01-24 MED ORDER — ALPRAZOLAM 0.5 MG PO TABS: 0.5000 mg | ORAL_TABLET | Freq: Every evening | ORAL | Status: DC | PRN

## 2016-05-31 ENCOUNTER — Other Ambulatory Visit (INDEPENDENT_AMBULATORY_CARE_PROVIDER_SITE_OTHER): Payer: BLUE CROSS/BLUE SHIELD

## 2016-05-31 DIAGNOSIS — Z0000 Encounter for general adult medical examination without abnormal findings: Secondary | ICD-10-CM

## 2016-05-31 LAB — HEPATIC FUNCTION PANEL
ALT: 11 U/L (ref 0–35)
AST: 12 U/L (ref 0–37)
Albumin: 4 g/dL (ref 3.5–5.2)
Alkaline Phosphatase: 51 U/L (ref 39–117)
Bilirubin, Direct: 0.1 mg/dL (ref 0.0–0.3)
Total Bilirubin: 0.4 mg/dL (ref 0.2–1.2)
Total Protein: 6.8 g/dL (ref 6.0–8.3)

## 2016-05-31 LAB — LIPID PANEL
Cholesterol: 198 mg/dL (ref 0–200)
HDL: 62.4 mg/dL
LDL Cholesterol: 123 mg/dL — ABNORMAL HIGH (ref 0–99)
NonHDL: 135.94
Total CHOL/HDL Ratio: 3
Triglycerides: 65 mg/dL (ref 0.0–149.0)
VLDL: 13 mg/dL (ref 0.0–40.0)

## 2016-05-31 LAB — CBC WITH DIFFERENTIAL/PLATELET
Basophils Absolute: 0 10*3/uL (ref 0.0–0.1)
Basophils Relative: 0.5 % (ref 0.0–3.0)
Eosinophils Absolute: 0.1 10*3/uL (ref 0.0–0.7)
Eosinophils Relative: 1 % (ref 0.0–5.0)
HCT: 36.6 % (ref 36.0–46.0)
Hemoglobin: 12.6 g/dL (ref 12.0–15.0)
Lymphocytes Relative: 33.9 % (ref 12.0–46.0)
Lymphs Abs: 2.1 10*3/uL (ref 0.7–4.0)
MCHC: 34.3 g/dL (ref 30.0–36.0)
MCV: 83.9 fl (ref 78.0–100.0)
Monocytes Absolute: 0.4 10*3/uL (ref 0.1–1.0)
Monocytes Relative: 7.3 % (ref 3.0–12.0)
Neutro Abs: 3.5 10*3/uL (ref 1.4–7.7)
Neutrophils Relative %: 57.3 % (ref 43.0–77.0)
Platelets: 331 10*3/uL (ref 150.0–400.0)
RBC: 4.36 Mil/uL (ref 3.87–5.11)
RDW: 14 % (ref 11.5–15.5)
WBC: 6.2 10*3/uL (ref 4.0–10.5)

## 2016-05-31 LAB — BASIC METABOLIC PANEL WITH GFR
BUN: 14 mg/dL (ref 6–23)
CO2: 28 meq/L (ref 19–32)
Calcium: 8.9 mg/dL (ref 8.4–10.5)
Chloride: 106 meq/L (ref 96–112)
Creatinine, Ser: 0.52 mg/dL (ref 0.40–1.20)
GFR: 137.14 mL/min
Glucose, Bld: 83 mg/dL (ref 70–99)
Potassium: 4.6 meq/L (ref 3.5–5.1)
Sodium: 141 meq/L (ref 135–145)

## 2016-05-31 LAB — TSH: TSH: 1.34 u[IU]/mL (ref 0.35–4.50)

## 2016-06-11 ENCOUNTER — Encounter: Payer: Self-pay | Admitting: Family Medicine

## 2016-06-11 ENCOUNTER — Ambulatory Visit (INDEPENDENT_AMBULATORY_CARE_PROVIDER_SITE_OTHER): Payer: BLUE CROSS/BLUE SHIELD | Admitting: Family Medicine

## 2016-06-11 VITALS — BP 100/80 | HR 98 | Temp 98.3°F | Ht 66.0 in | Wt 290.3 lb

## 2016-06-11 DIAGNOSIS — Z23 Encounter for immunization: Secondary | ICD-10-CM

## 2016-06-11 DIAGNOSIS — Z0000 Encounter for general adult medical examination without abnormal findings: Secondary | ICD-10-CM | POA: Diagnosis not present

## 2016-06-11 MED ORDER — ALBUTEROL SULFATE HFA 108 (90 BASE) MCG/ACT IN AERS: 2.0000 | INHALATION_SPRAY | RESPIRATORY_TRACT | 6 refills | Status: DC | PRN

## 2016-06-11 NOTE — Progress Notes (Signed)
Pre visit review using our clinic review tool, if applicable. No additional management support is needed unless otherwise documented below in the visit note. 

## 2016-06-11 NOTE — Progress Notes (Signed)
Subjective:     Patient ID: Sarah Munoz, female   DOB: March 02, 1976, 40 y.o.   MRN: [id number]  HPI Patient seen for physical exam. She sees gynecologist regularly. She has history of polycystic ovaries, obesity, mild intermittent asthma. Infrequently uses albuterol inhaler. Needs flu vaccine. She has had some modest weight loss over past year since last physical of about 9 pounds. She and her husband recently purchased a treadmill and recumbent bicycle which they plan to use more frequently. She has very busy work schedule. Tetanus up-to-date. Plans to get Pap smear and mammogram next month-per gyn.  Family history reviewed. Mother just diagnosed with lupus  Past Medical History:  Diagnosis Date  . ALLERGIC RHINITIS 04/27/2010  . Headache(784.0) 04/27/2010  . INSOMNIA, CHRONIC 04/27/2010  . PCOS (polycystic ovarian syndrome)    diagnosed by Dr Everett Graff  . Vitamin D deficiency    diagnosed by Dr Everett Graff (OB/GYN)   History reviewed. No pertinent surgical history.  reports that she quit smoking about 11 years ago. She has never used smokeless tobacco. She reports that she drinks alcohol. She reports that she does not use drugs. family history includes Cancer (age of onset: 53) in her father; Deep vein thrombosis in her mother; Diabetes in her maternal grandmother and paternal grandmother; Hyperlipidemia in her father and mother; Hypertension in her father and mother; Lupus (age of onset: 48) in her mother. No Known Allergies   Review of Systems  Constitutional: Negative for activity change, appetite change, fatigue, fever and unexpected weight change.  HENT: Negative for ear pain, hearing loss, sore throat and trouble swallowing.   Eyes: Negative for visual disturbance.  Respiratory: Negative for cough and shortness of breath.   Cardiovascular: Negative for chest pain and palpitations.  Gastrointestinal: Negative for abdominal pain, blood in stool, constipation and diarrhea.   Endocrine: Negative for polydipsia and polyuria.  Genitourinary: Negative for dysuria and hematuria.  Musculoskeletal: Negative for arthralgias, back pain and myalgias.  Skin: Negative for rash.  Neurological: Negative for dizziness, syncope and headaches.  Hematological: Negative for adenopathy.  Psychiatric/Behavioral: Negative for confusion and dysphoric mood.       Objective:   Physical Exam  Constitutional: She is oriented to person, place, and time. She appears well-developed and well-nourished.  HENT:  Head: Normocephalic and atraumatic.  Eyes: EOM are normal. Pupils are equal, round, and reactive to light.  Neck: Normal range of motion. Neck supple. No thyromegaly present.  Cardiovascular: Normal rate, regular rhythm and normal heart sounds.   No murmur heard. Pulmonary/Chest: Breath sounds normal. No respiratory distress. She has no wheezes. She has no rales.  Abdominal: Soft. Bowel sounds are normal. She exhibits no distension and no mass. There is no tenderness. There is no rebound and no guarding.  Genitourinary:  Genitourinary Comments: Per GYN  Musculoskeletal: Normal range of motion. She exhibits no edema.  Lymphadenopathy:    She has no cervical adenopathy.  Neurological: She is alert and oriented to person, place, and time. She displays normal reflexes. No cranial nerve deficit.  Skin: No rash noted.  Psychiatric: She has a normal mood and affect. Her behavior is normal. Judgment and thought content normal.       Assessment:     Physical exam. Patient has long-standing history of obesity but has had some modest success with weight loss this past year    Plan:     -Flu vaccine given -Labs reviewed with no major concerns -We discussed weight loss strategies -Establish  more consistent exercise -Routine follow-up in 1 year and sooner as needed  Eulas Post MD [facility]

## 2016-06-11 NOTE — Patient Instructions (Signed)
Continue with weight loss efforts

## 2016-06-14 ENCOUNTER — Telehealth: Payer: Self-pay | Admitting: Family Medicine

## 2016-06-14 NOTE — Telephone Encounter (Signed)
Pt need new Rx for alprazolam   Pharm:  [facility]

## 2016-06-17 NOTE — Telephone Encounter (Signed)
May refill #30.  Would only use sparingly for severe anxiety.

## 2016-06-17 NOTE — Telephone Encounter (Signed)
Okay to refill Alprazolam? Last fill 01/2016 # 60, 0 refill. Pt just had physical.

## 2016-06-18 ENCOUNTER — Telehealth: Payer: Self-pay

## 2016-06-18 MED ORDER — ALPRAZOLAM 0.5 MG PO TABS: 0.5000 mg | ORAL_TABLET | Freq: Every evening | ORAL | 0 refills | Status: DC | PRN

## 2016-06-18 NOTE — Telephone Encounter (Signed)
Error

## 2016-06-18 NOTE — Telephone Encounter (Signed)
Left detailed message on personal voicemail, Rx called into pharmacy as requested only 30 tablets this time. Dr. Elease Hashimoto said to use only sparingly for severe anxiety. Any questions please call office.

## 2016-09-04 ENCOUNTER — Other Ambulatory Visit: Payer: Self-pay | Admitting: Family Medicine

## 2016-09-04 NOTE — Telephone Encounter (Signed)
Last OV 06/11/2016  Last refill 06/18/2016 #30 Please advise

## 2016-09-05 MED ORDER — ALPRAZOLAM 0.5 MG PO TABS: 0.5000 mg | ORAL_TABLET | Freq: Every evening | ORAL | 0 refills | Status: DC | PRN

## 2016-09-05 NOTE — Telephone Encounter (Signed)
Refill once.  Avoid regular use. 

## 2016-11-05 ENCOUNTER — Other Ambulatory Visit: Payer: Self-pay | Admitting: Family Medicine

## 2016-11-05 MED ORDER — ALPRAZOLAM 0.5 MG PO TABS: 0.5000 mg | ORAL_TABLET | Freq: Every evening | ORAL | 0 refills | Status: DC | PRN

## 2017-01-24 ENCOUNTER — Other Ambulatory Visit: Payer: Self-pay | Admitting: Family Medicine

## 2017-01-27 ENCOUNTER — Other Ambulatory Visit: Payer: Self-pay | Admitting: *Deleted

## 2017-01-27 MED ORDER — ALPRAZOLAM 0.5 MG PO TABS: 0.5000 mg | ORAL_TABLET | Freq: Every evening | ORAL | 0 refills | Status: DC | PRN

## 2017-01-27 NOTE — Telephone Encounter (Signed)
Patient is aware ALPRAZolam (XANAX) 0.5 MG tablet is called in and she should try to stop PRN use.

## 2017-02-04 DIAGNOSIS — Z6841 Body Mass Index (BMI) 40.0 and over, adult: Secondary | ICD-10-CM | POA: Diagnosis not present

## 2017-02-04 DIAGNOSIS — Z01419 Encounter for gynecological examination (general) (routine) without abnormal findings: Secondary | ICD-10-CM | POA: Diagnosis not present

## 2017-02-04 DIAGNOSIS — Z1231 Encounter for screening mammogram for malignant neoplasm of breast: Secondary | ICD-10-CM | POA: Diagnosis not present

## 2017-02-04 DIAGNOSIS — Z124 Encounter for screening for malignant neoplasm of cervix: Secondary | ICD-10-CM | POA: Diagnosis not present

## 2017-03-10 ENCOUNTER — Encounter: Payer: Self-pay | Admitting: Family Medicine

## 2017-03-10 ENCOUNTER — Ambulatory Visit (INDEPENDENT_AMBULATORY_CARE_PROVIDER_SITE_OTHER): Payer: BLUE CROSS/BLUE SHIELD | Admitting: Family Medicine

## 2017-03-10 VITALS — BP 110/80 | HR 92 | Temp 98.6°F | Wt 290.6 lb

## 2017-03-10 DIAGNOSIS — R059 Cough, unspecified: Secondary | ICD-10-CM

## 2017-03-10 DIAGNOSIS — R05 Cough: Secondary | ICD-10-CM

## 2017-03-10 MED ORDER — BENZONATATE 200 MG PO CAPS: 200.0000 mg | ORAL_CAPSULE | Freq: Three times a day (TID) | ORAL | 0 refills | Status: DC | PRN

## 2017-03-10 MED ORDER — HYDROCOD POLST-CPM POLST ER 10-8 MG/5ML PO SUER: 5.0000 mL | Freq: Two times a day (BID) | ORAL | 0 refills | Status: DC | PRN

## 2017-03-10 NOTE — Progress Notes (Signed)
Subjective:     Patient ID: Sarah Munoz, female   DOB: Jan 27, 1976, 41 y.o.   MRN: [id number]  HPI Acute visit for mostly cough symptoms. Onset last Friday of nasal congestion, hoarseness, right ear pain, and sore throat. She had some chills on Saturday but no documented fever. Cough is dry. She tried NyQuil last night without relief. No dyspnea. No wheezing. No sick contacts.  Past Medical History:  Diagnosis Date  . ALLERGIC RHINITIS 04/27/2010  . Headache(784.0) 04/27/2010  . INSOMNIA, CHRONIC 04/27/2010  . PCOS (polycystic ovarian syndrome)    diagnosed by Dr Everett Graff  . Vitamin D deficiency    diagnosed by Dr Everett Graff (OB/GYN)   No past surgical history on file.  reports that she quit smoking about 12 years ago. She has never used smokeless tobacco. She reports that she drinks alcohol. She reports that she does not use drugs. family history includes Cancer (age of onset: 58) in her father; Deep vein thrombosis in her mother; Diabetes in her maternal grandmother and paternal grandmother; Hyperlipidemia in her father and mother; Hypertension in her father and mother; Lupus (age of onset: 45) in her mother. No Known Allergies   Review of Systems  HENT: Positive for congestion and sore throat.   Respiratory: Positive for cough. Negative for wheezing.   Cardiovascular: Negative for chest pain.       Objective:   Physical Exam  Constitutional: She appears well-developed and well-nourished.  HENT:  Right Ear: External ear normal.  Left Ear: External ear normal.  Mouth/Throat: Oropharynx is clear and moist.  Neck: Neck supple.  Cardiovascular: Normal rate and regular rhythm.   Pulmonary/Chest: Effort normal and breath sounds normal. No respiratory distress. She has no wheezes. She has no rales.  Lymphadenopathy:    She has no cervical adenopathy.       Assessment:     Cough. Suspect acute viral bronchitis    Plan:     -Tessalon Perles 200 mg every 8 hours as  needed for cough -Tussionex 1 teaspoon daily at bedtime as needed for severe cough -Follow-up promptly for any fever or worsening symptoms  Eulas Post MD [facility] at [facility]

## 2017-03-10 NOTE — Patient Instructions (Signed)

## 2017-05-29 ENCOUNTER — Encounter: Payer: Self-pay | Admitting: Family Medicine

## 2017-09-30 ENCOUNTER — Ambulatory Visit (INDEPENDENT_AMBULATORY_CARE_PROVIDER_SITE_OTHER): Payer: BLUE CROSS/BLUE SHIELD | Admitting: Family Medicine

## 2017-09-30 VITALS — BP 120/60 | HR 93 | Temp 98.4°F | Ht 66.0 in | Wt 311.9 lb

## 2017-09-30 DIAGNOSIS — G4700 Insomnia, unspecified: Secondary | ICD-10-CM | POA: Diagnosis not present

## 2017-09-30 DIAGNOSIS — F419 Anxiety disorder, unspecified: Secondary | ICD-10-CM | POA: Diagnosis not present

## 2017-09-30 DIAGNOSIS — M546 Pain in thoracic spine: Secondary | ICD-10-CM

## 2017-09-30 MED ORDER — ALPRAZOLAM 0.5 MG PO TABS: 0.5000 mg | ORAL_TABLET | Freq: Every evening | ORAL | 0 refills | Status: DC | PRN

## 2017-09-30 MED ORDER — METHOCARBAMOL 500 MG PO TABS: 500.0000 mg | ORAL_TABLET | Freq: Three times a day (TID) | ORAL | 0 refills | Status: DC | PRN

## 2017-09-30 NOTE — Progress Notes (Signed)
Subjective:     Patient ID: Sarah Munoz, female   DOB: January 01, 1976, 42 y.o.   MRN: [id number]  HPI Patient seen today to discuss the following issues  New and acute problem of left lower thoracic back pain. She was bending down yesterday to remove items from refrigerator and she twisted and felt pain in her left thoracic back area. Pain since that time since somewhat progressive and worse with movement. She took some ibuprofen 800 mg with minimal relief. She feels her muscles are tightening  up somewhat.  Denies prior history of back pain. No lower lumbar back pain. Denies any lower extremity weakness or numbness.  She has history of chronic insomnia. She states she has a difficult time "turning her brain off ". She feels anxious frequently and has what she describes as anxiety" attacks ". Not clear that she has ever been diagnosed with panic disorder. She takes fluoxetine only certain times of month for premenstrual dysphoric mood type symptoms. She does not wish to take anything daily. She has taken low-dose alprazolam in the past for stretches where she has had severe insomnia and we did recently recommend she try to come off. She tries uses infrequently. Denies any depression symptoms.  Past Medical History:  Diagnosis Date  . ALLERGIC RHINITIS 04/27/2010  . Headache(784.0) 04/27/2010  . INSOMNIA, CHRONIC 04/27/2010  . PCOS (polycystic ovarian syndrome)    diagnosed by Dr Everett Graff  . Vitamin D deficiency    diagnosed by Dr Everett Graff (OB/GYN)   No past surgical history on file.  reports that she quit smoking about 12 years ago. she has never used smokeless tobacco. She reports that she drinks alcohol. She reports that she does not use drugs. family history includes Cancer (age of onset: 62) in her father; Deep vein thrombosis in her mother; Diabetes in her maternal grandmother and paternal grandmother; Hyperlipidemia in her father and mother; Hypertension in her father and mother;  Lupus (age of onset: 68) in her mother. Allergies  Allergen Reactions  . Mold Extract  [Trichophyton]      Review of Systems  Constitutional: Negative for appetite change, chills, fever and unexpected weight change.  Respiratory: Negative for cough and shortness of breath.   Cardiovascular: Negative for chest pain.  Gastrointestinal: Negative for abdominal pain, nausea and vomiting.  Genitourinary: Negative for dysuria.  Musculoskeletal: Positive for back pain.  Skin: Negative for rash.  Neurological: Negative for weakness and numbness.  Hematological: Negative for adenopathy.       Objective:   Physical Exam  Constitutional: She appears well-developed and well-nourished.  Cardiovascular: Normal rate and regular rhythm.  Pulmonary/Chest: Effort normal and breath sounds normal. No respiratory distress. She has no wheezes. She has no rales.  Musculoskeletal: She exhibits no edema.  Straight leg raise are negative She has some muscular tenderness left lower thoracic back region. No spinal tenderness.  Psychiatric: She has a normal mood and affect. Her behavior is normal.       Assessment:     #1 acute muscular strain left lower thoracic back  #2 history of chronic insomnia and chronic anxiety    Plan:     -Trial of Robaxin 500 milligrams daily at bedtime -Recommend heat and/or ice for symptom relief and may continue ibuprofen as needed -Consider topical sports cream -Touch base in 1-2 weeks if no improvement -Discuss concerns for long-term benzodiazepine use. We recommended trying to use very infrequently and only for severe insomnia.  We explained that we  would recommend as a first tier of treatment regular use of Prozac if she develops more frequent anxiety symptoms  Eulas Post MD [facility] at [facility]

## 2017-09-30 NOTE — Patient Instructions (Signed)
Try heat or ice for symptom relief Consider OTC sports cream such as Biofreeze or [employer]

## 2018-06-12 ENCOUNTER — Other Ambulatory Visit: Payer: Self-pay | Admitting: Family Medicine

## 2018-06-12 NOTE — Telephone Encounter (Signed)
Last OV 09/30/17, No future OV  Last filled 09/30/17, # 30 with 0 refills

## 2018-06-14 MED ORDER — ALPRAZOLAM 0.5 MG PO TABS: 0.5000 mg | ORAL_TABLET | Freq: Every evening | ORAL | 0 refills | Status: DC | PRN

## 2018-06-14 NOTE — Telephone Encounter (Signed)
Refilled once   Avoid regular use.

## 2018-07-23 ENCOUNTER — Telehealth: Payer: BLUE CROSS/BLUE SHIELD | Admitting: Family

## 2018-07-23 DIAGNOSIS — R05 Cough: Secondary | ICD-10-CM

## 2018-07-23 DIAGNOSIS — R059 Cough, unspecified: Secondary | ICD-10-CM

## 2018-07-23 MED ORDER — BENZONATATE 100 MG PO CAPS: 100.0000 mg | ORAL_CAPSULE | Freq: Three times a day (TID) | ORAL | 0 refills | Status: DC | PRN

## 2018-07-23 MED ORDER — CHLORPHENIRAMINE MALEATE 2 MG/ML PO LIQD: 2.0000 mL | Freq: Four times a day (QID) | ORAL | 0 refills | Status: DC | PRN

## 2018-07-23 NOTE — Progress Notes (Signed)
Thank you for the details you included in the comment boxes. Those details are very helpful in determining the best course of treatment for you and help [place] to provide the best care. I see you had the hydrocodone/chlorpheniramine cough syrup in the past. The E-visit policy is that we do not prescribe narcotics or controlled substances of any kind. I sent the prescription for the Chlorpheniramine cough syrup, 5ml, which you can take every 6 hours as needed for cough since this worked for you in the past. I also sent Tessalon perles as mentioned below. If the Tessalon is the medication you mentioned being ineffective, tell the pharmacy not to fill that prescription for you and simply use the cough syrup instead. See plan below.  We are sorry that you are not feeling well.  Here is how we plan to help!  Based on your presentation I believe you most likely have A cough due to a virus.  This is called viral bronchitis and is best treated by rest, plenty of fluids and control of the cough.  You may use Ibuprofen or Tylenol as directed to help your symptoms.     In addition you may use A prescription cough medication called Tessalon Perles 100mg . You may take 1-2 capsules every 8 hours as needed for your cough.   From your responses in the eVisit questionnaire you describe inflammation in the upper respiratory tract which is causing a significant cough.  This is commonly called Bronchitis and has four common causes:    Allergies  Viral Infections  Acid Reflux  Bacterial Infection Allergies, viruses and acid reflux are treated by controlling symptoms or eliminating the cause. An example might be a cough caused by taking certain blood pressure medications. You stop the cough by changing the medication. Another example might be a cough caused by acid reflux. Controlling the reflux helps control the cough.  USE OF BRONCHODILATOR ("RESCUE") INHALERS: There is a risk from using your bronchodilator too  frequently.  The risk is that over-reliance on a medication which only relaxes the muscles surrounding the breathing tubes can reduce the effectiveness of medications prescribed to reduce swelling and congestion of the tubes themselves.  Although you feel brief relief from the bronchodilator inhaler, your asthma may actually be worsening with the tubes becoming more swollen and filled with mucus.  This can delay other crucial treatments, such as oral steroid medications. If you need to use a bronchodilator inhaler daily, several times per day, you should discuss this with your provider.  There are probably better treatments that could be used to keep your asthma under control.     HOME CARE . Only take medications as instructed by your medical team. . Complete the entire course of an antibiotic. . Drink plenty of fluids and get plenty of rest. . Avoid close contacts especially the very young and the elderly . Cover your mouth if you cough or cough into your sleeve. . Always remember to wash your hands . A steam or ultrasonic humidifier can help congestion.   GET HELP RIGHT AWAY IF: . You develop worsening fever. . You become short of breath . You cough up blood. . Your symptoms persist after you have completed your treatment plan MAKE SURE YOU   Understand these instructions.  Will watch your condition.  Will get help right away if you are not doing well or get worse.  Your e-visit answers were reviewed by a board certified advanced clinical practitioner to complete your  personal care plan.  Depending on the condition, your plan could have included both over the counter or prescription medications. If there is a problem please reply  once you have received a response from your provider. Your safety is important to [place].  If you have drug allergies check your prescription carefully.    You can use MyChart to ask questions about today's visit, request a non-urgent call back, or ask for a work  or school excuse for 24 hours related to this e-Visit. If it has been greater than 24 hours you will need to follow up with your provider, or enter a new e-Visit to address those concerns. You will get an e-mail in the next two days asking about your experience.  I hope that your e-visit has been valuable and will speed your recovery. Thank you for using e-visits.

## 2018-07-24 ENCOUNTER — Ambulatory Visit: Payer: BLUE CROSS/BLUE SHIELD | Admitting: Family Medicine

## 2018-11-17 ENCOUNTER — Ambulatory Visit (INDEPENDENT_AMBULATORY_CARE_PROVIDER_SITE_OTHER): Payer: BLUE CROSS/BLUE SHIELD | Admitting: Family Medicine

## 2018-11-17 ENCOUNTER — Other Ambulatory Visit: Payer: Self-pay

## 2018-11-17 ENCOUNTER — Encounter: Payer: Self-pay | Admitting: Family Medicine

## 2018-11-17 VITALS — BP 122/78 | HR 116 | Temp 99.9°F | Ht 66.0 in | Wt 267.8 lb

## 2018-11-17 DIAGNOSIS — R52 Pain, unspecified: Secondary | ICD-10-CM | POA: Diagnosis not present

## 2018-11-17 DIAGNOSIS — J101 Influenza due to other identified influenza virus with other respiratory manifestations: Secondary | ICD-10-CM

## 2018-11-17 LAB — POCT INFLUENZA A/B
Influenza A, POC: POSITIVE — AB
Influenza B, POC: NEGATIVE

## 2018-11-17 MED ORDER — OSELTAMIVIR PHOSPHATE 75 MG PO CAPS: 75.0000 mg | ORAL_CAPSULE | Freq: Two times a day (BID) | ORAL | 0 refills | Status: DC

## 2018-11-17 MED ORDER — HYDROCOD POLST-CPM POLST ER 10-8 MG/5ML PO SUER: 5.0000 mL | Freq: Two times a day (BID) | ORAL | 0 refills | Status: DC | PRN

## 2018-11-17 NOTE — Patient Instructions (Signed)

## 2018-11-17 NOTE — Progress Notes (Signed)
  Subjective:     Patient ID: Sarah Munoz, female   DOB: Jul 17, 1976, 43 y.o.   MRN: [id number]  HPI Patient is seen with acute febrile illness.  Onset Sunday of fever up to 101.5, chest congestion, nasal congestion, body aches, fatigue.  No recent travels.  Husband has similar though somewhat milder symptoms.  No nausea or vomiting.  Cough has been severe at times.  Not relieved with over-the-counter medications such as NyQuil.  Requesting refill of Tussionex which she has taken in the past  Past Medical History:  Diagnosis Date  . ALLERGIC RHINITIS 04/27/2010  . Headache(784.0) 04/27/2010  . INSOMNIA, CHRONIC 04/27/2010  . PCOS (polycystic ovarian syndrome)    diagnosed by Dr Everett Graff  . Vitamin D deficiency    diagnosed by Dr Everett Graff (OB/GYN)   History reviewed. No pertinent surgical history.  reports that she quit smoking about 14 years ago. She has never used smokeless tobacco. She reports current alcohol use. She reports that she does not use drugs. family history includes Cancer (age of onset: 48) in her father; Deep vein thrombosis in her mother; Diabetes in her maternal grandmother and paternal grandmother; Hyperlipidemia in her father and mother; Hypertension in her father and mother; Lupus (age of onset: 65) in her mother. Allergies  Allergen Reactions  . Mold Extract  [Trichophyton]   . Molds & Smuts      Review of Systems  Constitutional: Positive for chills, fatigue and fever.  HENT: Positive for congestion. Negative for sinus pressure.   Respiratory: Positive for cough.   Cardiovascular: Negative for chest pain.  Neurological: Positive for headaches.       Objective:   Physical Exam Constitutional:      Appearance: Normal appearance. She is not toxic-appearing.  HENT:     Right Ear: Tympanic membrane normal.     Left Ear: Tympanic membrane normal.     Mouth/Throat:     Pharynx: Oropharynx is clear. No oropharyngeal exudate.  Neck:   Musculoskeletal: Neck supple.  Cardiovascular:     Rate and Rhythm: Regular rhythm. Tachycardia present.  Pulmonary:     Effort: Pulmonary effort is normal.     Breath sounds: Normal breath sounds. No wheezing or rales.  Lymphadenopathy:     Cervical: No cervical adenopathy.  Neurological:     Mental Status: She is alert.        Assessment:     Acute febrile illness.  Positive influenza A    Plan:     -Tamiflu 75 mg twice daily for 5 days -Plenty of fluids and rest -Tussionex 1 teaspoon nightly for severe cough 60 mL's dispensed -Follow-up promptly for any worsening symptoms -Work note was written for today through Friday  Eulas Post MD [facility] at [facility]

## 2019-06-09 ENCOUNTER — Encounter: Payer: Self-pay | Admitting: Family Medicine

## 2019-06-09 ENCOUNTER — Other Ambulatory Visit: Payer: Self-pay | Admitting: Family Medicine

## 2019-06-10 ENCOUNTER — Other Ambulatory Visit: Payer: Self-pay | Admitting: Family Medicine

## 2019-06-10 ENCOUNTER — Telehealth: Payer: Self-pay | Admitting: Family Medicine

## 2019-06-10 DIAGNOSIS — Z1231 Encounter for screening mammogram for malignant neoplasm of breast: Secondary | ICD-10-CM

## 2019-06-10 NOTE — Telephone Encounter (Signed)
Patient dropped of lab results to go over at her visit next week.  Disposition: Dr's green folder

## 2019-06-14 NOTE — Telephone Encounter (Signed)
Lab results attached to appointment paper work

## 2019-06-15 ENCOUNTER — Ambulatory Visit (INDEPENDENT_AMBULATORY_CARE_PROVIDER_SITE_OTHER): Payer: BC Managed Care – PPO | Admitting: Family Medicine

## 2019-06-15 ENCOUNTER — Encounter: Payer: Self-pay | Admitting: Family Medicine

## 2019-06-15 ENCOUNTER — Other Ambulatory Visit: Payer: Self-pay

## 2019-06-15 VITALS — BP 120/64 | HR 102 | Temp 98.2°F | Wt 241.3 lb

## 2019-06-15 DIAGNOSIS — J4520 Mild intermittent asthma, uncomplicated: Secondary | ICD-10-CM | POA: Diagnosis not present

## 2019-06-15 DIAGNOSIS — E785 Hyperlipidemia, unspecified: Secondary | ICD-10-CM | POA: Diagnosis not present

## 2019-06-15 DIAGNOSIS — F419 Anxiety disorder, unspecified: Secondary | ICD-10-CM | POA: Diagnosis not present

## 2019-06-15 MED ORDER — ALBUTEROL SULFATE HFA 108 (90 BASE) MCG/ACT IN AERS: 2.0000 | INHALATION_SPRAY | RESPIRATORY_TRACT | 2 refills | Status: AC | PRN

## 2019-06-15 MED ORDER — ALPRAZOLAM 0.5 MG PO TABS: 0.5000 mg | ORAL_TABLET | Freq: Every evening | ORAL | 0 refills | Status: DC | PRN

## 2019-06-15 NOTE — Patient Instructions (Signed)
Preventing High Cholesterol °Cholesterol is a white, waxy substance similar to fat that the human body needs to help build cells. The liver makes all the cholesterol that a person's body needs. Having high cholesterol (hypercholesterolemia) increases a person's risk for heart disease and stroke. Extra (excess) cholesterol comes from the food the person eats. °High cholesterol can often be prevented with diet and lifestyle changes. If you already have high cholesterol, you can control it with diet and lifestyle changes and with medicine. °How can high cholesterol affect me? °If you have high cholesterol, deposits (plaques) may build up on the walls of your arteries. The arteries are the blood vessels that carry blood away from your heart. °Plaques make the arteries narrower and stiffer. This can limit or block blood flow and cause blood clots to form. Blood clots: °· Are tiny balls of cells that form in your blood. °· Can move to the heart or brain, causing a heart attack or stroke. °Plaques in arteries greatly increase your risk for heart attack and stroke.Making diet and lifestyle changes can reduce your risk for these conditions that may threaten your life. °What can increase my risk? °This condition is more likely to develop in people who: °· Eat foods that are high in saturated fat or cholesterol. Saturated fat is mostly found in: °? Foods that contain animal fat, such as red meat and some dairy products. °? Certain fatty foods made from plants, such as tropical oils. °· Are overweight. °· Are not getting enough exercise. °· Have a family history of high cholesterol. °What actions can I take to prevent this? °Nutrition ° °· Eat less saturated fat. °· Avoid trans fats (partially hydrogenated oils). These are often found in margarine and in some baked goods, fried foods, and snacks bought in packages. °· Avoid precooked or cured meat, such as sausages or meat loaves. °· Avoid foods and drinks that have added  sugars. °· Eat more fruits, vegetables, and whole grains. °· Choose healthy sources of protein, such as fish, poultry, lean cuts of red meat, beans, peas, lentils, and nuts. °· Choose healthy sources of fat, such as: °? Nuts. °? Vegetable oils, especially olive oil. °? Fish that have healthy fats (omega-3 fatty acids), such as mackerel or salmon. °The items listed above may not be a complete list of recommended foods and beverages. Contact a dietitian for more information. °Lifestyle °· Lose weight if you are overweight. Losing 5-10 lb (2.3-4.5 kg) can help prevent or control high cholesterol. It can also lower your risk for diabetes and high blood pressure. Ask your health care provider to help you with a diet and exercise plan to lose weight safely. °· Do not use any products that contain nicotine or tobacco, such as cigarettes, e-cigarettes, and chewing tobacco. If you need help quitting, ask your health care provider. °· Limit your alcohol intake. °? Do not drink alcohol if: °§ Your health care provider tells you not to drink. °§ You are pregnant, may be pregnant, or are planning to become pregnant. °? If you drink alcohol: °§ Limit how much you use to: °§ 0-1 drink a day for women. °§ 0-2 drinks a day for men. °§ Be aware of how much alcohol is in your drink. In the [place] one drink equals one 12 oz bottle of beer (355 mL), one 5 oz glass of wine (148 mL), or one 1½ oz glass of hard liquor (44 mL). °Activity ° °· Get enough exercise. Each week, do at   least 150 minutes of exercise that takes a medium level of effort (moderate-intensity exercise). °? This is exercise that: °§ Makes your heart beat faster and makes you breathe harder than usual. °§ Allows you to still be able to talk. °? You could exercise in short sessions several times a day or longer sessions a few times a week. For example, on 5 days each week, you could walk fast or ride your bike 3 times a day for 10 minutes each time. °· Do exercises as told  by your health care provider. °Medicines °· In addition to diet and lifestyle changes, your health care provider may recommend medicines to help lower cholesterol. This may be a medicine to lower the amount of cholesterol your liver makes. You may need medicine if: °? Diet and lifestyle changes do not lower your cholesterol enough. °? You have high cholesterol and other risk factors for heart disease or stroke. °· Take over-the-counter and prescription medicines only as told by your health care provider. °General information °· Manage your risk factors for high cholesterol. Talk with your health care provider about all your risk factors and how to lower your risk. °· Manage other conditions that you have, such as diabetes or high blood pressure (hypertension). °· Have blood tests to check your cholesterol levels at regular points in time as told by your health care provider. °· Keep all follow-up visits as told by your health care provider. This is important. °Where to find more information °· American Heart Association: www.heart.org °· National Heart, Lung, and Blood Institute: www.nhlbi.nih.gov °Summary °· High cholesterol increases your risk for heart disease and stroke. By keeping your cholesterol level low, you can reduce your risk for these conditions. °· High cholesterol can often be prevented with diet and lifestyle changes. °· Work with your health care provider to manage your risk factors, and have your blood tested regularly. °This information is not intended to replace advice given to you by your health care provider. Make sure you discuss any questions you have with your health care provider. °Document Released: 09/10/2015 Document Revised: 12/18/2018 Document Reviewed: 05/04/2016 °Elsevier Patient Education © 2020 [employer] ° °

## 2019-06-15 NOTE — Progress Notes (Signed)
  Subjective:     Patient ID: Sarah Munoz, female   DOB: [birth year], 43 y.o.   MRN: [id number]  HPI Patient is seen for several issues as follows  Recent lab work through her work.  She wanted to discuss similar results.  She had lipids, chemistry, CBC.  These were all basically normal with exception of elevated cholesterol of 234.  She had HDL of 67, triglycerides 83, LDL cholesterol 153.  No history of CAD or vascular disease.  No history of hypertension.  No history of diabetes.  She has never smoked.  No family history of premature CAD.  She has done tremendous job with weight loss and over the past year and  has lost a total of 70 pounds.  She has done this largely through overall calorie restriction and restricting carbohydrates.  She has not yet started exercising regularly.  History of intermittent asthma.  Requesting refills of albuterol.  She has history of panic attacks and these are very infrequent.  She took herself off Prozac last year.  Requesting refill of Xanax which she very infrequently takes  Past Medical History:  Diagnosis Date  . ALLERGIC RHINITIS 04/27/2010  . Headache(784.0) 04/27/2010  . INSOMNIA, CHRONIC 04/27/2010  . PCOS (polycystic ovarian syndrome)    diagnosed by Dr Everett Graff  . Vitamin D deficiency    diagnosed by Dr Everett Graff (OB/GYN)   No past surgical history on file.  reports that she quit smoking about 14 years ago. She has never used smokeless tobacco. She reports current alcohol use. She reports that she does not use drugs. family history includes Cancer (age of onset: 50) in her father; Deep vein thrombosis in her mother; Diabetes in her maternal grandmother and paternal grandmother; Hyperlipidemia in her father and mother; Hypertension in her father and mother; Lupus (age of onset: 64) in her mother. Allergies  Allergen Reactions  . Mold Extract  [Trichophyton]   . Molds & Smuts      Review of Systems  Constitutional: Negative for  fatigue and unexpected weight change.  Eyes: Negative for visual disturbance.  Respiratory: Negative for cough, chest tightness, shortness of breath and wheezing.   Cardiovascular: Negative for chest pain, palpitations and leg swelling.  Endocrine: Negative for polydipsia and polyuria.  Neurological: Negative for dizziness, seizures, syncope, weakness, light-headedness and headaches.       Objective:   Physical Exam Constitutional:      Appearance: Normal appearance.  Cardiovascular:     Rate and Rhythm: Normal rate and regular rhythm.  Pulmonary:     Effort: Pulmonary effort is normal.     Breath sounds: Normal breath sounds.  Musculoskeletal:     Right lower leg: No edema.     Left lower leg: No edema.  Neurological:     Mental Status: She is alert.        Assessment:     #1 hyperlipidemia.  Overall very low risk for CAD over the next 10 years with risk calculated at less than 1%  #2 mild intermittent asthma-stable  #3 history of anxiety with probable panic attacks which occur very infrequently    Plan:     -Refilled albuterol for as needed use -1 refill of alprazolam which she uses very infrequently for severe breakthrough anxiety -Reviewed her labs in detail.  Continue low saturated fat diet.  Eulas Post MD [facility] [place]

## 2019-06-18 ENCOUNTER — Ambulatory Visit
Admission: RE | Admit: 2019-06-18 | Discharge: 2019-06-18 | Disposition: A | Discharge status: Home / Self Care | Payer: BC Managed Care – PPO | Source: Ambulatory Visit | Attending: Family Medicine | Admitting: Family Medicine

## 2019-06-18 ENCOUNTER — Other Ambulatory Visit: Payer: Self-pay

## 2019-06-18 DIAGNOSIS — Z1231 Encounter for screening mammogram for malignant neoplasm of breast: Secondary | ICD-10-CM

## 2019-12-31 ENCOUNTER — Other Ambulatory Visit: Payer: Self-pay | Admitting: Family Medicine

## 2020-01-03 MED ORDER — ALPRAZOLAM 0.5 MG PO TABS: 0.5000 mg | ORAL_TABLET | Freq: Every evening | ORAL | 0 refills | Status: DC | PRN

## 2020-01-03 NOTE — Telephone Encounter (Signed)
Last ov:06/15/2019 Last filled:06/15/2019

## 2020-02-22 ENCOUNTER — Other Ambulatory Visit: Payer: Self-pay

## 2020-02-23 ENCOUNTER — Encounter: Payer: Self-pay | Admitting: Family Medicine

## 2020-02-23 ENCOUNTER — Ambulatory Visit (INDEPENDENT_AMBULATORY_CARE_PROVIDER_SITE_OTHER): Payer: BC Managed Care – PPO | Admitting: Family Medicine

## 2020-02-23 VITALS — BP 122/76 | HR 77 | Temp 98.2°F | Wt 217.8 lb

## 2020-02-23 DIAGNOSIS — R29898 Other symptoms and signs involving the musculoskeletal system: Secondary | ICD-10-CM

## 2020-02-23 NOTE — Patient Instructions (Signed)
Suspect normal Xiphoid process but be in touch for any increase in size or increasing pain.

## 2020-02-23 NOTE — Progress Notes (Signed)
Established Patient Office Visit  Subjective:  Patient ID: Sarah Munoz, female    DOB: 10-27-1975  Age: 44 y.o. MRN: [id number]  CC:  Chief Complaint  Patient presents with  . Mass    on chest pt states noticed awhile but noticed bigger over weekend     HPI [facility] presents for prominence noted xiphoid process.  She described this initially as a "lump on the chest ".  She has lost 100 pounds over the past couple years and wonders if she just has noted her xiphoid process because of her weight loss.  Occasionally sore to touch.  No history of recent injury.  No breast mass.  She had mammogram in October which was normal.  Past Medical History:  Diagnosis Date  . ALLERGIC RHINITIS 04/27/2010  . Headache(784.0) 04/27/2010  . INSOMNIA, CHRONIC 04/27/2010  . PCOS (polycystic ovarian syndrome)    diagnosed by Dr Everett Graff  . Vitamin D deficiency    diagnosed by Dr Everett Graff (OB/GYN)    No past surgical history on file.  Family History  Problem Relation Age of Onset  . Hyperlipidemia Mother   . Hypertension Mother   . Deep vein thrombosis Mother   . Lupus Mother 25  . Hyperlipidemia Father   . Hypertension Father   . Cancer Father 72       esophageal cancer  . Diabetes Maternal Grandmother   . Breast cancer Maternal Grandmother   . Diabetes Paternal Grandmother     Social History   Socioeconomic History  . Marital status: Married    Spouse name: Not on file  . Number of children: Not on file  . Years of education: Not on file  . Highest education level: Not on file  Occupational History    Employer: [employer]    Comment: Area Manager  Tobacco Use  . Smoking status: Former Smoker    Quit date: 10/12/2004    Years since quitting: 15.3  . Smokeless tobacco: Never Used  Vaping Use  . Vaping Use: Former  Substance and Sexual Activity  . Alcohol use: Yes    Comment: Occasionally  . Drug use: No  . Sexual activity: Not on file  Other Topics  Concern  . Not on file  Social History Narrative  . Not on file   Social Determinants of Health   Financial Resource Strain:   . Difficulty of Paying Living Expenses:   Food Insecurity:   . Worried About Shiane fundraiser in the Last Year:   . Arboriculturist in the Last Year:   Transportation Needs:   . Film/video editor (Medical):   Marland Kitchen Lack of Transportation (Non-Medical):   Physical Activity:   . Days of Exercise per Week:   . Minutes of Exercise per Session:   Stress:   . Feeling of Stress :   Social Connections:   . Frequency of Communication with Friends and Family:   . Frequency of Social Gatherings with Friends and Family:   . Attends Religious Services:   . Active Member of Clubs or Organizations:   . Attends Archivist Meetings:   Marland Kitchen Marital Status:   Intimate Partner Violence:   . Fear of Current or Ex-Partner:   . Emotionally Abused:   Marland Kitchen Physically Abused:   . Sexually Abused:     Outpatient Medications Prior to Visit  Medication Sig Dispense Refill  . albuterol (VENTOLIN HFA) 108 (90 Base) MCG/ACT inhaler Inhale  2 puffs into the lungs every 4 (four) hours as needed. 6.7 g 2  . ALPRAZolam (XANAX) 0.5 MG tablet Take 1-2 tablets (0.5-1 mg total) by mouth at bedtime as needed. 30 tablet 0  . FLUoxetine (PROZAC) 20 MG capsule Take 20 mg by mouth daily. Takes 1 week prior to menstrual cycle     No facility-administered medications prior to visit.    Allergies  Allergen Reactions  . Mold Extract  [Trichophyton]   . Molds & Smuts     ROS Review of Systems  Constitutional: Negative for appetite change and fever.  Respiratory: Negative for cough and shortness of breath.   Cardiovascular: Negative for chest pain.  Gastrointestinal: Negative for abdominal pain, nausea and vomiting.  Hematological: Negative for adenopathy.      Objective:    Physical Exam Vitals reviewed.  Constitutional:      Appearance: Normal appearance.    Cardiovascular:     Rate and Rhythm: Normal rate and regular rhythm.  Pulmonary:     Comments: Normal palpated xiphoid process by exam   No worrisome masses noted.  Non-tender.   No abdominal mass. Neurological:     Mental Status: She is alert.     BP 122/76 (BP Location: Left Arm, Patient Position: Sitting, Cuff Size: Normal)   Pulse 77   Temp 98.2 F (36.8 C) (Temporal)   Wt 217 lb 12.8 oz (98.8 kg)   SpO2 98%   BMI 35.15 kg/m  Wt Readings from Last 3 Encounters:  02/23/20 217 lb 12.8 oz (98.8 kg)  06/15/19 241 lb 4.8 oz (109.5 kg)  11/17/18 267 lb 12.8 oz (121.5 kg)     Health Maintenance Due  Topic Date Due  . Hepatitis C Screening  Never done  . COVID-19 Vaccine (1) Never done  . HIV Screening  Never done  . PAP SMEAR-Modifier  02/16/2017    There are no preventive care reminders to display for this patient.  Lab Results  Component Value Date   TSH 1.34 05/31/2016   Lab Results  Component Value Date   WBC 6.2 05/31/2016   HGB 12.6 05/31/2016   HCT 36.6 05/31/2016   MCV 83.9 05/31/2016   PLT 331.0 05/31/2016   Lab Results  Component Value Date   NA 141 05/31/2016   K 4.6 05/31/2016   CO2 28 05/31/2016   GLUCOSE 83 05/31/2016   BUN 14 05/31/2016   CREATININE 0.52 05/31/2016   BILITOT 0.4 05/31/2016   ALKPHOS 51 05/31/2016   AST 12 05/31/2016   ALT 11 05/31/2016   PROT 6.8 05/31/2016   ALBUMIN 4.0 05/31/2016   CALCIUM 8.9 05/31/2016   GFR 137.14 05/31/2016   Lab Results  Component Value Date   CHOL 198 05/31/2016   Lab Results  Component Value Date   HDL 62.40 05/31/2016   Lab Results  Component Value Date   LDLCALC 123 (H) 05/31/2016   Lab Results  Component Value Date   TRIG 65.0 05/31/2016    No results found for: HGBA1C    Assessment & Plan:   Normal appearing Xiphoid process by exam    Suspect this has become more prominent with her 100+ pound weight loss.  -reassurance.  Follow up for any pain or  growth/enlargement.  No orders of the defined types were placed in this encounter.   Follow-up: No follow-ups on file.    Carolann Littler, MD

## 2020-05-30 ENCOUNTER — Other Ambulatory Visit: Payer: Self-pay | Admitting: Family Medicine

## 2020-05-30 MED ORDER — ALPRAZOLAM 0.5 MG PO TABS: 0.5000 mg | ORAL_TABLET | Freq: Every evening | ORAL | 0 refills | Status: DC | PRN

## 2020-05-30 NOTE — Telephone Encounter (Signed)
Forwarding to PCP for approval  

## 2020-07-25 ENCOUNTER — Other Ambulatory Visit: Payer: Self-pay | Admitting: Family Medicine

## 2020-07-25 DIAGNOSIS — Z1231 Encounter for screening mammogram for malignant neoplasm of breast: Secondary | ICD-10-CM

## 2020-07-27 DIAGNOSIS — Z1231 Encounter for screening mammogram for malignant neoplasm of breast: Secondary | ICD-10-CM

## 2020-09-06 ENCOUNTER — Ambulatory Visit
Admission: RE | Admit: 2020-09-06 | Discharge: 2020-09-06 | Disposition: A | Discharge status: Home / Self Care | Payer: BC Managed Care – PPO | Source: Ambulatory Visit | Attending: Family Medicine | Admitting: Family Medicine

## 2020-09-06 ENCOUNTER — Other Ambulatory Visit: Payer: Self-pay

## 2020-09-06 DIAGNOSIS — Z1231 Encounter for screening mammogram for malignant neoplasm of breast: Secondary | ICD-10-CM | POA: Diagnosis not present

## 2020-09-29 ENCOUNTER — Other Ambulatory Visit: Payer: Self-pay

## 2020-10-02 ENCOUNTER — Other Ambulatory Visit: Payer: Self-pay

## 2020-10-02 ENCOUNTER — Encounter: Payer: Self-pay | Admitting: Family Medicine

## 2020-10-02 ENCOUNTER — Ambulatory Visit (INDEPENDENT_AMBULATORY_CARE_PROVIDER_SITE_OTHER): Payer: BC Managed Care – PPO | Admitting: Family Medicine

## 2020-10-02 VITALS — BP 118/68 | HR 80 | Ht 66.0 in | Wt 199.0 lb

## 2020-10-02 DIAGNOSIS — E559 Vitamin D deficiency, unspecified: Secondary | ICD-10-CM | POA: Insufficient documentation

## 2020-10-02 DIAGNOSIS — N920 Excessive and frequent menstruation with regular cycle: Secondary | ICD-10-CM | POA: Insufficient documentation

## 2020-10-02 DIAGNOSIS — N943 Premenstrual tension syndrome: Secondary | ICD-10-CM | POA: Insufficient documentation

## 2020-10-02 DIAGNOSIS — R5383 Other fatigue: Secondary | ICD-10-CM | POA: Insufficient documentation

## 2020-10-02 DIAGNOSIS — M7521 Bicipital tendinitis, right shoulder: Secondary | ICD-10-CM

## 2020-10-02 NOTE — Progress Notes (Signed)
Established Patient Office Visit  Subjective:  Patient ID: Sarah Munoz, female    DOB: [birth year]  Age: 45 y.o. MRN: [id number]  CC:  Chief Complaint  Patient presents with  . Shoulder Pain    HPI Sarah Munoz presents for right biceps pain for the past 5 or 6 weeks.  Denies any specific injury.  Pain is somewhat intermittent.  Worse with activities.  Denies any actual shoulder pain.  No neck pain.  No radiculitis symptoms.  No upper extremity numbness or weakness.  She tried some Advil with mild relief.  No history of similar problem.  Right-hand-dominant.  Pain is located along the proximal to mid anterior biceps region.  She has not seen any visible swelling.  No rashes.  Past Medical History:  Diagnosis Date  . ALLERGIC RHINITIS 04/27/2010  . Headache(784.0) 04/27/2010  . INSOMNIA, CHRONIC 04/27/2010  . PCOS (polycystic ovarian syndrome)    diagnosed by Dr Everett Graff  . Vitamin D deficiency    diagnosed by Dr Everett Graff (OB/GYN)    No past surgical history on file.  Family History  Problem Relation Age of Onset  . Hyperlipidemia Mother   . Hypertension Mother   . Deep vein thrombosis Mother   . Lupus Mother 76  . Hyperlipidemia Father   . Hypertension Father   . Cancer Father 49       esophageal cancer  . Diabetes Maternal Grandmother   . Breast cancer Maternal Grandmother   . Diabetes Paternal Grandmother     Social History   Socioeconomic History  . Marital status: Married    Spouse name: Not on file  . Number of children: Not on file  . Years of education: Not on file  . Highest education level: Not on file  Occupational History    Employer: [employer]    Comment: Area Manager  Tobacco Use  . Smoking status: Former Smoker    Quit date: 10/12/2004    Years since quitting: 15.9  . Smokeless tobacco: Never Used  Vaping Use  . Vaping Use: Former  Substance and Sexual Activity  . Alcohol use: Yes    Comment: Occasionally  . Drug use: No   . Sexual activity: Not on file  Other Topics Concern  . Not on file  Social History Narrative  . Not on file   Social Determinants of Health   Financial Resource Strain: Not on file  Food Insecurity: Not on file  Transportation Needs: Not on file  Physical Activity: Not on file  Stress: Not on file  Social Connections: Not on file  Intimate Partner Violence: Not on file    Outpatient Medications Prior to Visit  Medication Sig Dispense Refill  . albuterol (VENTOLIN HFA) 108 (90 Base) MCG/ACT inhaler Inhale 2 puffs into the lungs every 4 (four) hours as needed. 6.7 g 2  . ALPRAZolam (XANAX) 0.5 MG tablet Take 1-2 tablets (0.5-1 mg total) by mouth at bedtime as needed. 30 tablet 0  . ergocalciferol (VITAMIN D2) 1.25 MG (50000 UT) capsule Vitamin D2 1,250 mcg (50,000 unit) capsule    . FLUoxetine (PROZAC) 20 MG capsule Take 20 mg by mouth daily. Takes 1 week prior to menstrual cycle     No facility-administered medications prior to visit.    Allergies  Allergen Reactions  . Mold Extract  [Trichophyton]   . Molds & Smuts     ROS Review of Systems  Musculoskeletal: Negative for neck pain.  Skin: Negative for rash.  Neurological: Negative for weakness and numbness.  Hematological: Negative for adenopathy.      Objective:    Physical Exam Vitals reviewed.  Constitutional:      Appearance: Normal appearance.  Cardiovascular:     Rate and Rhythm: Normal rate and regular rhythm.  Musculoskeletal:     Comments: Right upper extremity reveals no edema.  No localized muscular tenderness.  She has good range of motion right shoulder.  No significant pain with abduction against resistance or internal rotation.  No acromioclavicular tenderness.  Neurological:     Mental Status: She is alert.     BP 118/68   Pulse 80   Ht 5\' 6"  (1.676 m)   Wt 199 lb (90.3 kg)   SpO2 99%   BMI 32.12 kg/m  Wt Readings from Last 3 Encounters:  10/02/20 199 lb (90.3 kg)  02/23/20 217 lb  12.8 oz (98.8 kg)  06/15/19 241 lb 4.8 oz (109.5 kg)     Health Maintenance Due  Topic Date Due  . Hepatitis C Screening  Never done  . COVID-19 Vaccine (1) Never done  . HIV Screening  Never done  . PAP SMEAR-Modifier  02/16/2017  . COLONOSCOPY (Pts 45-65yrs Insurance coverage will need to be confirmed)  Never done  . INFLUENZA VACCINE  04/09/2020    There are no preventive care reminders to display for this patient.  Lab Results  Component Value Date   TSH 1.34 05/31/2016   Lab Results  Component Value Date   WBC 6.2 05/31/2016   HGB 12.6 05/31/2016   HCT 36.6 05/31/2016   MCV 83.9 05/31/2016   PLT 331.0 05/31/2016   Lab Results  Component Value Date   NA 141 05/31/2016   K 4.6 05/31/2016   CO2 28 05/31/2016   GLUCOSE 83 05/31/2016   BUN 14 05/31/2016   CREATININE 0.52 05/31/2016   BILITOT 0.4 05/31/2016   ALKPHOS 51 05/31/2016   AST 12 05/31/2016   ALT 11 05/31/2016   PROT 6.8 05/31/2016   ALBUMIN 4.0 05/31/2016   CALCIUM 8.9 05/31/2016   GFR 137.14 05/31/2016   Lab Results  Component Value Date   CHOL 198 05/31/2016   Lab Results  Component Value Date   HDL 62.40 05/31/2016   Lab Results  Component Value Date   LDLCALC 123 (H) 05/31/2016   Lab Results  Component Value Date   TRIG 65.0 05/31/2016   Lab Results  Component Value Date   CHOLHDL 3 05/31/2016   No results found for: HGBA1C    Assessment & Plan:   Right anterior biceps pain.  Suspect biceps tendinitis.  Doubt rotator cuff pathology.  -We recommend that she consider icing 15 to 20 minutes a few times daily -Consider trial of over-the-counter diclofenac/Voltaren gel 3-4 times daily -If not improving in a few weeks consider follow-up with sports medicine to further evaluate  No orders of the defined types were placed in this encounter.   Follow-up: No follow-ups on file.    Carolann Littler, MD

## 2020-10-02 NOTE — Patient Instructions (Signed)
Proximal Biceps Tendinitis and Tenosynovitis  The proximal biceps tendon is a strong cord of tissue that connects the biceps muscle on the front of the upper arm to the shoulder blade. Tendinitis is inflammation of a tendon. Tenosynovitis is inflammation of the lining around the tendon (tendon sheath). These conditions often occur at the same time, and they can interfere with the ability to bend the elbow and turn the palm of the hand up. Proximal biceps tendinitis and tenosynovitis are usually caused by overusing the shoulder joint and the biceps muscle. These conditions usually heal within 6 weeks. Proximal biceps tendinitis may include a grade 1 or grade 2 strain of the tendon.  A grade 1 strain is mild, and it involves a slight pull of the tendon without any stretching or noticeable tearing of the tendon. There is usually no loss of biceps muscle strength.  A grade 2 strain is moderate, and it involves a small tear in the tendon. The tendon is stretched, and biceps strength is usually decreased. What are the causes? This condition may be caused by:  A sudden increase in frequency or intensity of activity that involves the shoulder and the biceps muscle.  Overuse of the biceps muscle. This can happen when you do the same movements over and over, such as: ? Turning the palm of the hand up. ? Forceful straightening (hyperextension) of the elbow. ? Bending the elbow.  A direct, forceful hit or injury to the elbow. This is rare. What increases the risk? The following factors may make you more likely to develop this condition:  Playing contact sports.  Playing sports that involve throwing and overhead movements, including racket sports, gymnastics, weight lifting, or bodybuilding.  Doing physical labor.  Having poor strength and flexibility of the arm and shoulder. What are the signs or symptoms? Symptoms of this condition may include:  Pain and inflammation in the front of the  shoulder.  A feeling of warmth in the front of the shoulder.  Limited range of motion of the shoulder and the elbow.  A crackling sound (crepitation) when you move or touch the shoulder or the upper arm. In some cases, symptoms may return after treatment, and they may be long-lasting (chronic). How is this diagnosed? This condition is diagnosed based on:  Your symptoms.  Your medical history.  Physical exam.  X-ray or MRI, if needed. How is this treated? Treatment for this condition depends on the severity of your injury. It may include:  Resting the injured arm.  Icing the injured area.  Doing physical therapy. Your health care provider may also use:  Medicines to treat pain and inflammation.  Sound waves to treat the injured muscle (ultrasound therapy).  Medicines that are injected to the muscle (corticosteroids).  Medicines that numb the area (local anesthetics).  Surgery. This is done if other treatments have not worked. Follow these instructions at home: Managing pain, stiffness, and swelling  If directed, put ice on the injured area. ? Put ice in a plastic bag. ? Place a towel between your skin and the bag. ? Leave the ice on for 20 minutes, 2-3 times a day.  If directed, apply heat to the affected area before you exercise. Use the heat source that your health care provider recommends, such as a moist heat pack or a heating pad. ? Place a towel between your skin and the heat source. ? Leave the heat on for 20-30 minutes. ? Remove the heat if your skin turns bright  red. This is especially important if you are unable to feel pain, heat, or cold. You may have a greater risk of getting burned.  Move your fingers often to reduce stiffness and swelling.  Raise (elevate) the injured area above the level of your heart while you are lying down.      Activity  Do not lift anything that is heavier than 10 lb (4.5 kg), or the limit that you are told, until your  health care provider says that it is safe.  Avoid activities that cause pain or make your condition worse.  Return to your normal activities as told by your health care provider. Ask your health care provider what activities are safe for you.  Do exercises as told by your health care provider. General instructions  Take over-the-counter and prescription medicines only as told by your health care provider.  Do not use any products that contain nicotine or tobacco, such as cigarettes, e-cigarettes, and chewing tobacco. These can delay healing. If you need help quitting, ask your health care provider.  Keep all follow-up visits as told by your health care provider. This is important. How is this prevented?  Warm up and stretch before being active.  Cool down and stretch after being active.  Give your body time to rest between periods of activity.  Make sure any equipment that you use is fitted to you.  Be safe and responsible while being active to avoid falls.  Maintain physical fitness, including: ? Strength. ? Flexibility. ? Heart health (cardiovascular fitness). ? The ability to use muscles for a long time (endurance). Contact a health care provider if:  You have symptoms that get worse or do not get better after 2 weeks of treatment.  You develop new symptoms. Get help right away if:  You develop severe pain. Summary  Tendinitis is inflammation of the biceps tendon. Tenosynovitis is inflammation of the lining around the biceps tendon. These conditions often occur at the same time.  These conditions are usually caused by overusing the shoulder joint and biceps muscle.  Symptoms include pain, warmth in the shoulder, and limited range of motion.  The two conditions are treated with rest, ice, medicines, and surgery (rare). This information is not intended to replace advice given to you by your health care provider. Make sure you discuss any questions you have with your  health care provider. Document Revised: 12/22/2018 Document Reviewed: 10/22/2018 Elsevier Patient Education  [place].  Consider icing 15-20 minutes several times daily  Consider over the counter Diclofenac/Voltaren gel.    Let me know if not improved in 3-4 weeks.

## 2020-12-16 ENCOUNTER — Other Ambulatory Visit: Payer: Self-pay | Admitting: Family Medicine

## 2020-12-18 MED ORDER — ALPRAZOLAM 0.5 MG PO TABS: 0.5000 mg | ORAL_TABLET | Freq: Every evening | ORAL | 0 refills | Status: DC | PRN

## 2020-12-18 NOTE — Telephone Encounter (Signed)
Last filled 05/30/2020 Last OV 10/02/2020 (acute)  Ok to fill? Patient is due for a physical

## 2021-06-25 ENCOUNTER — Other Ambulatory Visit: Payer: Self-pay | Admitting: Family Medicine

## 2021-06-25 DIAGNOSIS — Z1231 Encounter for screening mammogram for malignant neoplasm of breast: Secondary | ICD-10-CM

## 2021-07-07 ENCOUNTER — Other Ambulatory Visit: Payer: Self-pay | Admitting: Family Medicine

## 2021-07-09 MED ORDER — ALPRAZOLAM 0.5 MG PO TABS: 0.5000 mg | ORAL_TABLET | Freq: Every evening | ORAL | 0 refills | Status: DC | PRN

## 2021-07-09 NOTE — Telephone Encounter (Signed)
Last filled 12/18/2020 Last OV 10/02/2020  Ok to fill?

## 2021-09-07 ENCOUNTER — Ambulatory Visit
Admission: RE | Admit: 2021-09-07 | Discharge: 2021-09-07 | Disposition: A | Discharge status: Home / Self Care | Payer: BC Managed Care – PPO | Source: Ambulatory Visit | Attending: Family Medicine | Admitting: Family Medicine

## 2021-09-07 ENCOUNTER — Other Ambulatory Visit: Payer: Self-pay

## 2021-09-07 DIAGNOSIS — Z1231 Encounter for screening mammogram for malignant neoplasm of breast: Secondary | ICD-10-CM

## 2021-10-24 ENCOUNTER — Encounter: Payer: BC Managed Care – PPO | Admitting: Family Medicine

## 2021-11-02 ENCOUNTER — Ambulatory Visit (INDEPENDENT_AMBULATORY_CARE_PROVIDER_SITE_OTHER): Payer: BC Managed Care – PPO | Admitting: Family Medicine

## 2021-11-02 ENCOUNTER — Encounter: Payer: Self-pay | Admitting: Family Medicine

## 2021-11-02 VITALS — BP 106/70 | HR 68 | Temp 98.0°F | Ht 66.0 in | Wt 178.5 lb

## 2021-11-02 DIAGNOSIS — Z0000 Encounter for general adult medical examination without abnormal findings: Secondary | ICD-10-CM

## 2021-11-02 DIAGNOSIS — Z23 Encounter for immunization: Secondary | ICD-10-CM

## 2021-11-02 NOTE — Progress Notes (Signed)
Established Patient Office Visit  Subjective:  Patient ID: Sarah Munoz, female    DOB: 10-Jan-1976  Age: 46 y.o. MRN: [id number]  CC:  Chief Complaint  Patient presents with   Annual Exam    HPI Sarah Munoz presents for physical exam.  She started her own weight loss program about 4 years ago with low carb diet and has lost 135 pounds and is maintaining that.  Currently not exercising regularly.  Feels better overall.  Still vapes but a small amount.  No specific complaints today.  She had recent lab work through her employer with cholesterol 254 and triglycerides of 72 and HDL of 89 with LDL 153.  Other labs including chemistries, CBC, thyroid normal.  Health maintenance  -No flu vaccine -She had mammogram in December -Overdue for Pap smear and she plans to set that up with GYN soon -No history of hepatitis C screening but low risk -Overdue for tetanus  Family history-father died around age 77 of esophagus cancer.  He had hypertension hyperlipidemia.  Mother has history of hypertension, hyperlipidemia, lupus, history of DVT.  She has a brother and 2 sisters.  Brother had coronary disease age 30.  Social history-married.  No children.  Works for a Merchandiser, retail.  Vapes as above.  Quit smoking 2006.  Usually 3-4 alcoholic beverages per week.  Past Medical History:  Diagnosis Date   ALLERGIC RHINITIS 04/27/2010   Headache(784.0) 04/27/2010   INSOMNIA, CHRONIC 04/27/2010   PCOS (polycystic ovarian syndrome)    diagnosed by Dr Everett Graff   Vitamin D deficiency    diagnosed by Dr Everett Graff (OB/GYN)    History reviewed. No pertinent surgical history.  Family History  Problem Relation Age of Onset   Hyperlipidemia Mother    Hypertension Mother    Deep vein thrombosis Mother    Lupus Mother 89   Hyperlipidemia Father    Hypertension Father    Cancer Father 50       esophageal cancer   Heart disease Brother    Breast cancer Maternal Aunt    Diabetes Maternal  Grandmother    Diabetes Paternal Grandmother     Social History   Socioeconomic History   Marital status: Married    Spouse name: Not on file   Number of children: Not on file   Years of education: Not on file   Highest education level: Not on file  Occupational History    Employer: [employer]    Comment: Conservation officer, nature  Tobacco Use   Smoking status: Former    Types: Cigarettes    Quit date: 10/12/2004    Years since quitting: 17.0   Smokeless tobacco: Never  Vaping Use   Vaping Use: Former  Substance and Sexual Activity   Alcohol use: Yes    Comment: Occasionally   Drug use: No   Sexual activity: Not on file  Other Topics Concern   Not on file  Social History Narrative   Not on file   Social Determinants of Health   Financial Resource Strain: Not on file  Food Insecurity: Not on file  Transportation Needs: Not on file  Physical Activity: Not on file  Stress: Not on file  Social Connections: Not on file  Intimate Partner Violence: Not on file    Outpatient Medications Prior to Visit  Medication Sig Dispense Refill   albuterol (VENTOLIN HFA) 108 (90 Base) MCG/ACT inhaler Inhale 2 puffs into the lungs every 4 (four) hours as needed. 6.7  g 2   ALPRAZolam (XANAX) 0.5 MG tablet Take 1-2 tablets (0.5-1 mg total) by mouth at bedtime as needed. 30 tablet 0   No facility-administered medications prior to visit.    Allergies  Allergen Reactions   Mold Extract  [Trichophyton]    Molds & Smuts     ROS Review of Systems  Constitutional:  Negative for activity change, appetite change, fatigue, fever and unexpected weight change.  HENT:  Negative for ear pain, hearing loss, sore throat and trouble swallowing.   Eyes:  Negative for visual disturbance.  Respiratory:  Negative for cough and shortness of breath.   Cardiovascular:  Negative for chest pain and palpitations.  Gastrointestinal:  Negative for abdominal pain, blood in stool, constipation and diarrhea.   Endocrine: Negative for polydipsia and polyuria.  Genitourinary:  Negative for dysuria and hematuria.  Musculoskeletal:  Negative for arthralgias, back pain and myalgias.  Skin:  Negative for rash.  Neurological:  Negative for dizziness, syncope and headaches.  Hematological:  Negative for adenopathy.  Psychiatric/Behavioral:  Negative for confusion and dysphoric mood.      Objective:    Physical Exam Constitutional:      Appearance: She is well-developed.  HENT:     Head: Normocephalic and atraumatic.     Right Ear: Tympanic membrane normal.     Left Ear: Tympanic membrane normal.  Eyes:     Pupils: Pupils are equal, round, and reactive to light.  Neck:     Thyroid: No thyromegaly.  Cardiovascular:     Rate and Rhythm: Normal rate and regular rhythm.     Heart sounds: Normal heart sounds. No murmur heard. Pulmonary:     Effort: No respiratory distress.     Breath sounds: Normal breath sounds. No wheezing or rales.  Abdominal:     General: Bowel sounds are normal. There is no distension.     Palpations: Abdomen is soft. There is no mass.     Tenderness: There is no abdominal tenderness. There is no guarding or rebound.  Musculoskeletal:     Cervical back: Normal range of motion and neck supple.  Lymphadenopathy:     Cervical: No cervical adenopathy.  Skin:    Findings: No rash.  Neurological:     Mental Status: She is alert and oriented to person, place, and time.     Cranial Nerves: No cranial nerve deficit.  Psychiatric:        Behavior: Behavior normal.        Thought Content: Thought content normal.        Judgment: Judgment normal.    BP 106/70 (BP Location: Left Arm, Patient Position: Sitting, Cuff Size: Normal)    Pulse 68    Temp 98 F (36.7 C) (Oral)    Ht 5\' 6"  (1.676 m)    Wt 178 lb 8 oz (81 kg)    SpO2 98%    BMI 28.81 kg/m  Wt Readings from Last 3 Encounters:  11/02/21 178 lb 8 oz (81 kg)  10/02/20 199 lb (90.3 kg)  02/23/20 217 lb 12.8 oz (98.8 kg)      Health Maintenance Due  Topic Date Due   HIV Screening  Never done   Hepatitis C Screening  Never done   PAP SMEAR-Modifier  02/16/2017   COLONOSCOPY (Pts 45-33yrs Insurance coverage will need to be confirmed)  Never done   COVID-19 Vaccine (4 - Booster for Latimer series) 11/03/2020    There are no preventive care reminders to  display for this patient.  Lab Results  Component Value Date   TSH 1.34 05/31/2016   Lab Results  Component Value Date   WBC 6.2 05/31/2016   HGB 12.6 05/31/2016   HCT 36.6 05/31/2016   MCV 83.9 05/31/2016   PLT 331.0 05/31/2016   Lab Results  Component Value Date   NA 141 05/31/2016   K 4.6 05/31/2016   CO2 28 05/31/2016   GLUCOSE 83 05/31/2016   BUN 14 05/31/2016   CREATININE 0.52 05/31/2016   BILITOT 0.4 05/31/2016   ALKPHOS 51 05/31/2016   AST 12 05/31/2016   ALT 11 05/31/2016   PROT 6.8 05/31/2016   ALBUMIN 4.0 05/31/2016   CALCIUM 8.9 05/31/2016   GFR 137.14 05/31/2016   Lab Results  Component Value Date   CHOL 198 05/31/2016   Lab Results  Component Value Date   HDL 62.40 05/31/2016   Lab Results  Component Value Date   LDLCALC 123 (H) 05/31/2016   Lab Results  Component Value Date   TRIG 65.0 05/31/2016   Lab Results  Component Value Date   CHOLHDL 3 05/31/2016   No results found for: HGBA1C    Assessment & Plan:   Problem List Items Addressed This Visit   None Visit Diagnoses     Physical exam    -  Primary   Need for influenza vaccination       Relevant Orders   Flu Vaccine QUAD 6+ mos PF IM (Fluarix Quad PF) (Completed)   Need for tetanus, diphtheria, and acellular pertussis (Tdap) vaccine       Relevant Orders   Tdap vaccine greater than or equal to 7yo IM (Completed)     She has done tremendous job with weight loss over the past few years.  We did discuss importance of establishing more consistent exercise.  We discussed the following health maintenance issues  -Flu vaccine given -Tdap  given -No labs since she had these done with her work recently. -Consider hepatitis C antibody with next lab draw -We did discuss briefly coronary calcium scan based on family history of premature CAD. -We discussed colonoscopy.  She will check on insurance coverage to see if this is covered at her current age of 68 -She plans to set up GYN follow-up for Pap smear  No orders of the defined types were placed in this encounter.   Follow-up: No follow-ups on file.    Carolann Littler, MD

## 2021-11-02 NOTE — Patient Instructions (Signed)
Set up GYN follow up   Consider colonoscopy and check on insurance coverage.

## 2021-12-19 ENCOUNTER — Other Ambulatory Visit: Payer: Self-pay | Admitting: Family Medicine

## 2021-12-21 DIAGNOSIS — N951 Menopausal and female climacteric states: Secondary | ICD-10-CM | POA: Diagnosis not present

## 2021-12-21 DIAGNOSIS — Z124 Encounter for screening for malignant neoplasm of cervix: Secondary | ICD-10-CM | POA: Diagnosis not present

## 2021-12-21 DIAGNOSIS — Z01419 Encounter for gynecological examination (general) (routine) without abnormal findings: Secondary | ICD-10-CM | POA: Diagnosis not present

## 2021-12-21 DIAGNOSIS — E559 Vitamin D deficiency, unspecified: Secondary | ICD-10-CM | POA: Diagnosis not present

## 2021-12-21 MED ORDER — ALPRAZOLAM 0.5 MG PO TABS: 0.5000 mg | ORAL_TABLET | Freq: Every evening | ORAL | 0 refills | Status: DC | PRN

## 2022-01-30 IMAGING — MG MM DIGITAL SCREENING BILAT W/ TOMO AND CAD
8 series · 8 of 24 positions shown · non-contrast
Comparison: Previous exam(s).

CLINICAL DATA: Screening.

EXAM:
DIGITAL SCREENING BILATERAL MAMMOGRAM WITH TOMOSYNTHESIS AND CAD
TECHNIQUE: Bilateral screening digital craniocaudal and mediolateral oblique
mammograms were obtained. Bilateral screening digital breast
tomosynthesis was performed. The images were evaluated with
computer-aided detection.

[L CC synth-2D]
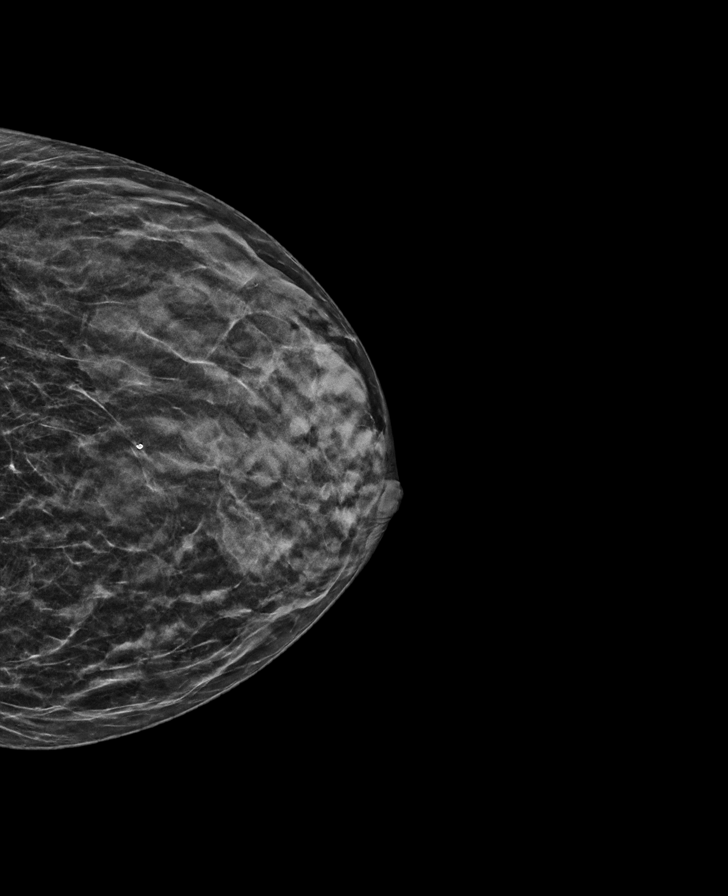

[R CC synth-2D]
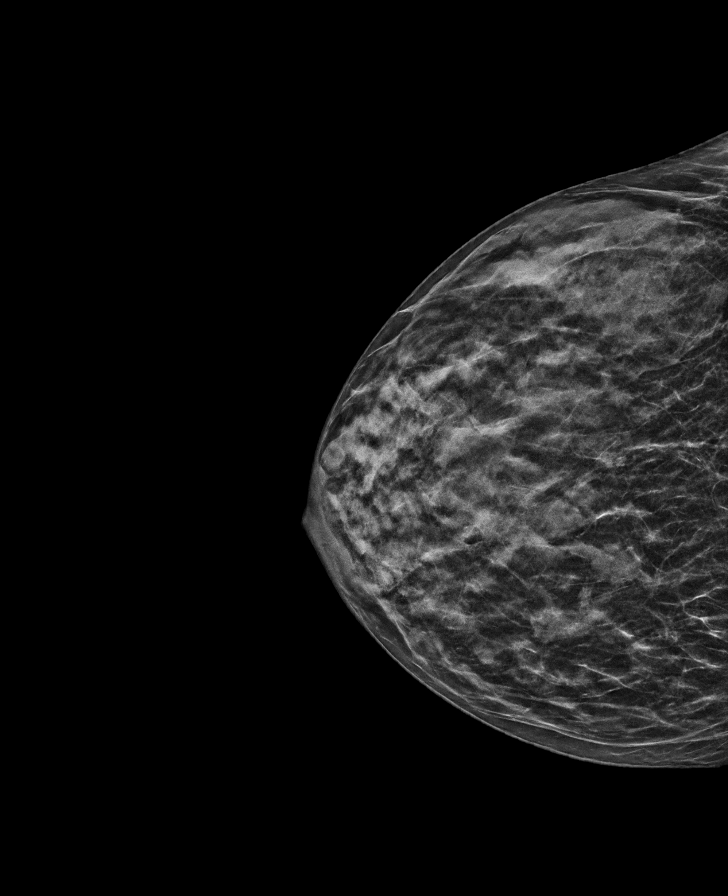

[R MLO synth-2D]
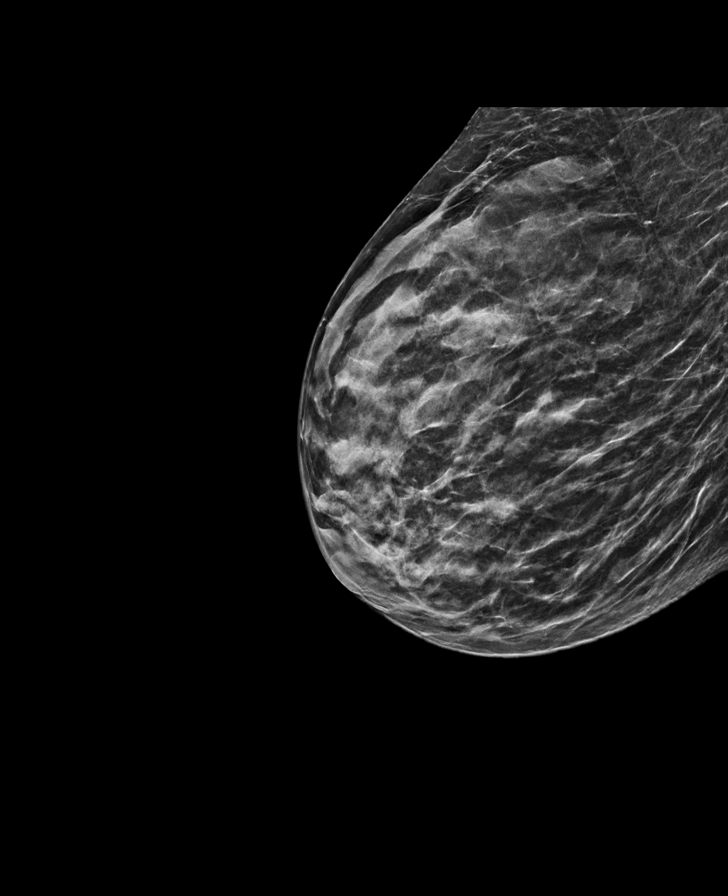

[L MLO synth-2D]
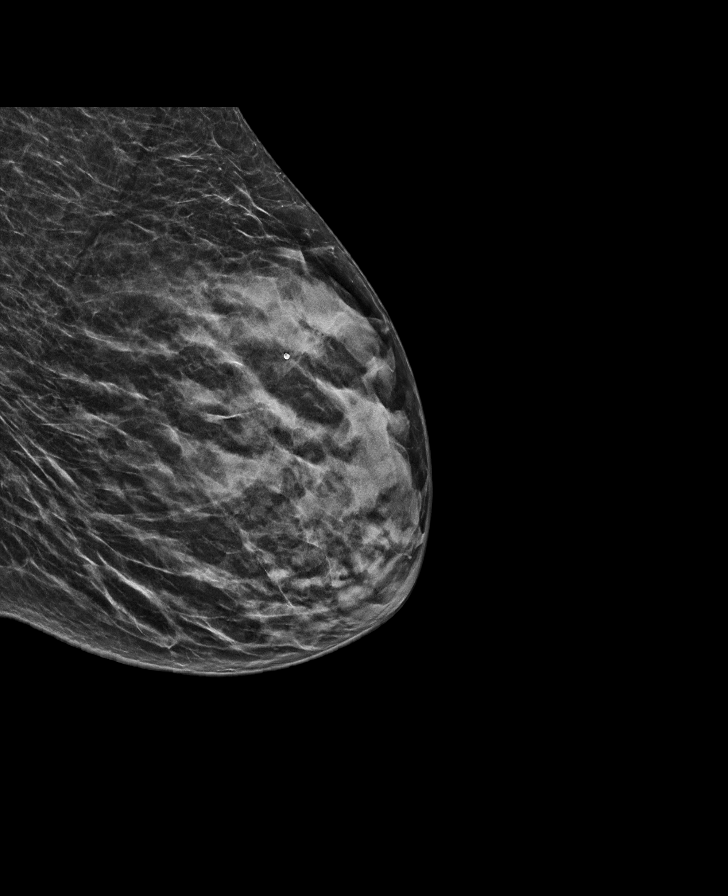

[L MLO tomo · tomo slice 23/45.0]
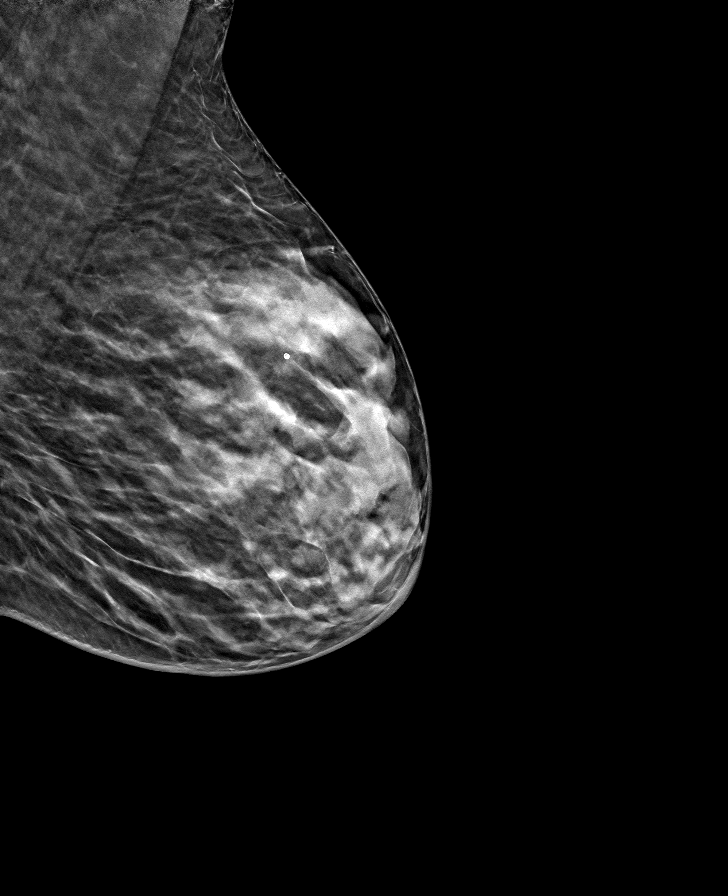

[L CC tomo · tomo slice 21/42.0]
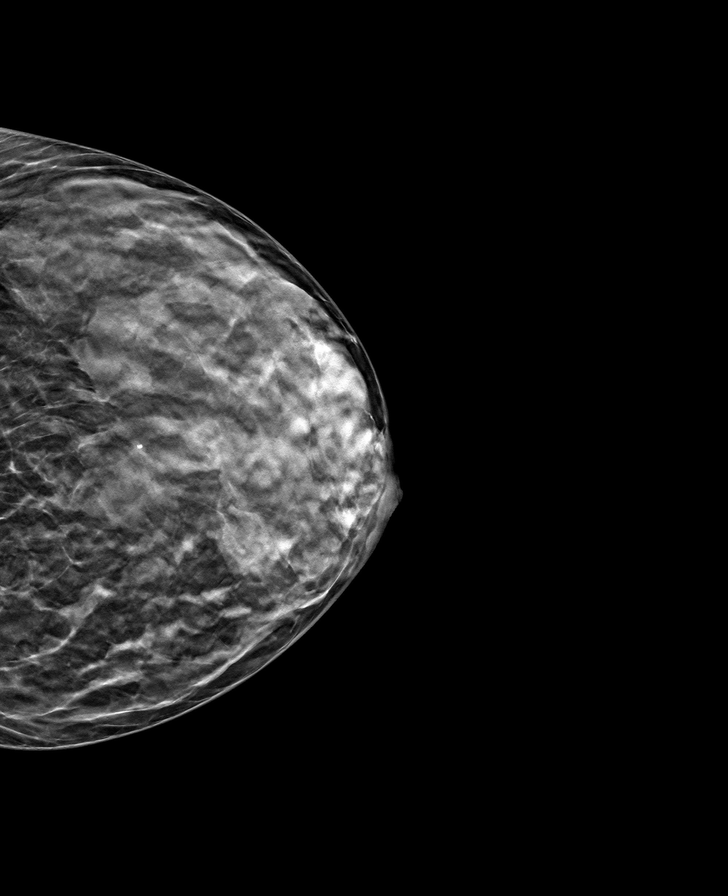

[R CC tomo · tomo slice 21/42.0]
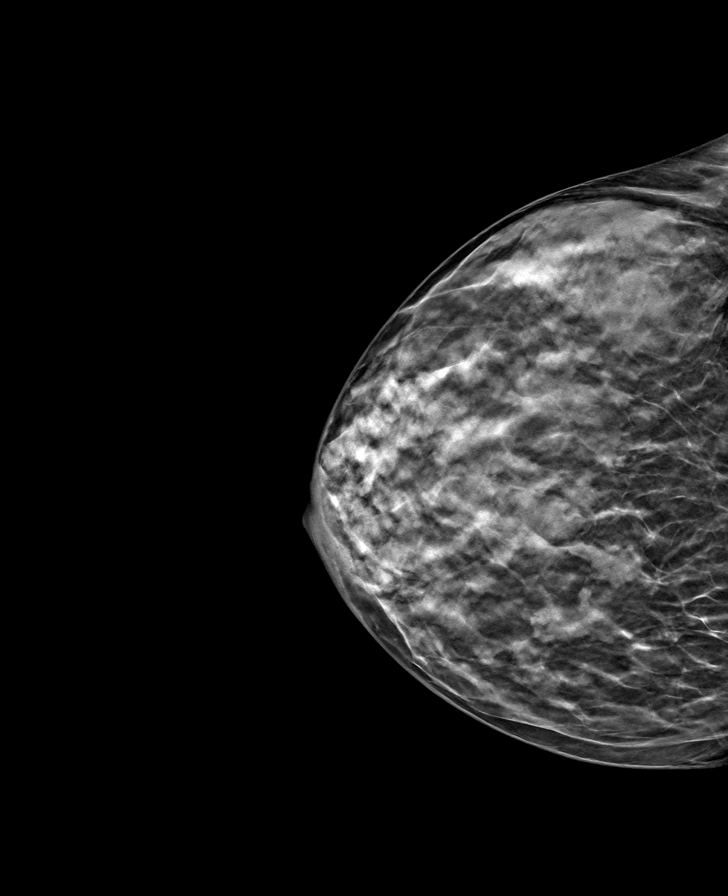

[R MLO tomo · tomo slice 23/46.0]
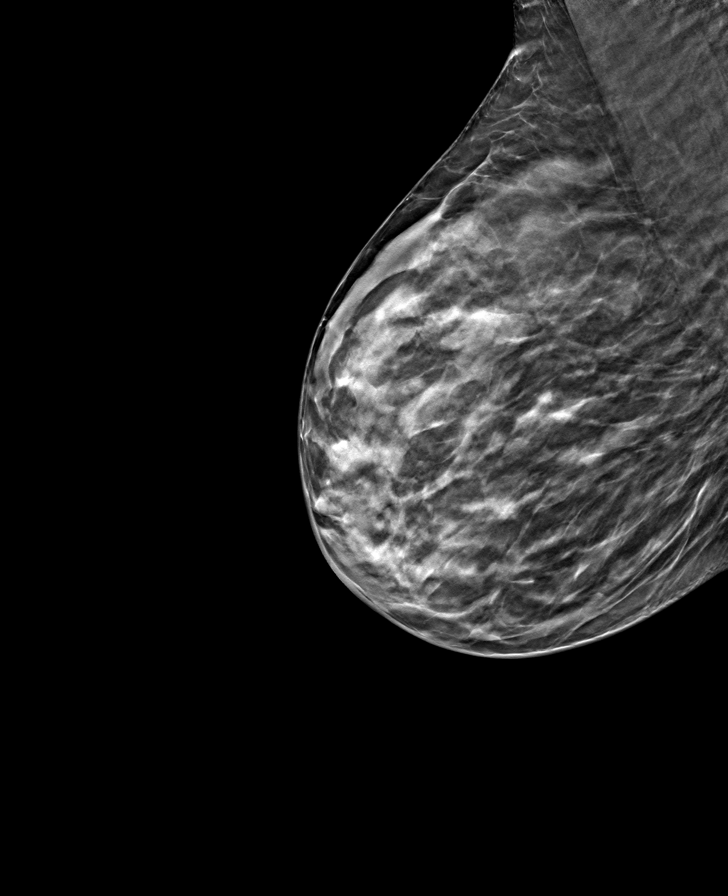

[8 of 24 positions shown; findings below may reference images not displayed]

ACR Breast Density Category c: The breast tissue is heterogeneously
dense, which may obscure small masses.
FINDINGS: There are no findings suspicious for malignancy.
IMPRESSION: No mammographic evidence of malignancy. A result letter of this
screening mammogram will be mailed directly to the patient.

RECOMMENDATION:
Screening mammogram in one year. (Code:Q3-W-BC3)

BI-RADS CATEGORY  1: Negative.

## 2022-02-20 DIAGNOSIS — N951 Menopausal and female climacteric states: Secondary | ICD-10-CM | POA: Diagnosis not present

## 2022-02-20 DIAGNOSIS — Z09 Encounter for follow-up examination after completed treatment for conditions other than malignant neoplasm: Secondary | ICD-10-CM | POA: Diagnosis not present

## 2022-05-06 ENCOUNTER — Encounter: Payer: Self-pay | Admitting: Family Medicine

## 2022-05-06 DIAGNOSIS — Z1211 Encounter for screening for malignant neoplasm of colon: Secondary | ICD-10-CM

## 2022-05-17 ENCOUNTER — Encounter: Payer: Self-pay | Admitting: Internal Medicine

## 2022-05-17 ENCOUNTER — Ambulatory Visit (AMBULATORY_SURGERY_CENTER): Payer: Self-pay

## 2022-05-17 VITALS — Ht 66.0 in | Wt 187.0 lb

## 2022-05-17 DIAGNOSIS — Z1211 Encounter for screening for malignant neoplasm of colon: Secondary | ICD-10-CM

## 2022-05-17 MED ORDER — NA SULFATE-K SULFATE-MG SULF 17.5-3.13-1.6 GM/177ML PO SOLN: 1.0000 | Freq: Once | ORAL | 0 refills | Status: AC

## 2022-05-17 NOTE — Progress Notes (Signed)
PV completed via phone call; Patient verified name and DOB; No egg or soy allergy known to patient  No issues known to pt with past sedation with any surgeries or procedures Patient denies ever being told they had issues or difficulty with intubation  No FH of Malignant Hyperthermia Pt is not on diet pills Pt is not on home 02  Pt is not on blood thinners  Pt denies issues with constipation  No A fib or A flutter Have any cardiac testing pending--NO Pt instructed to use Singlecare.com or GoodRx for a price reduction on prep  Insurance verified during [employer] Questions from patient answered during PV -Patient advised to call back to the office at [phone] should questions/concerns arise; Patient verbalized understanding of information/instructions;

## 2022-05-26 ENCOUNTER — Encounter: Payer: Self-pay | Admitting: Certified Registered Nurse Anesthetist

## 2022-05-29 ENCOUNTER — Encounter: Payer: Self-pay | Admitting: Internal Medicine

## 2022-05-29 ENCOUNTER — Ambulatory Visit (AMBULATORY_SURGERY_CENTER): Payer: BC Managed Care – PPO | Admitting: Internal Medicine

## 2022-05-29 VITALS — BP 113/54 | HR 69 | Temp 97.3°F | Resp 14 | Ht 66.0 in | Wt 187.0 lb

## 2022-05-29 DIAGNOSIS — D125 Benign neoplasm of sigmoid colon: Secondary | ICD-10-CM | POA: Diagnosis not present

## 2022-05-29 DIAGNOSIS — D120 Benign neoplasm of cecum: Secondary | ICD-10-CM | POA: Diagnosis not present

## 2022-05-29 DIAGNOSIS — Z1211 Encounter for screening for malignant neoplasm of colon: Secondary | ICD-10-CM | POA: Diagnosis not present

## 2022-05-29 MED ORDER — SODIUM CHLORIDE 0.9 % IV SOLN: 500.0000 mL | Freq: Once | INTRAVENOUS | Status: DC

## 2022-05-29 NOTE — Progress Notes (Signed)
Called to room to assist during endoscopic procedure.  Patient ID and intended procedure confirmed with present staff. Received instructions for my participation in the procedure from the performing physician.  

## 2022-05-29 NOTE — Progress Notes (Signed)
GASTROENTEROLOGY PROCEDURE H&P NOTE   Primary Care Physician: Eulas Post, MD    Reason for Procedure:   Colon cancer screening  Plan:    Colonoscopy  Patient is appropriate for endoscopic procedure(s) in the ambulatory ([place]) setting.  The nature of the procedure, as well as the risks, benefits, and alternatives were carefully and thoroughly reviewed with the patient. Ample time for discussion and questions allowed. The patient understood, was satisfied, and agreed to proceed.     HPI: Sarah Munoz is a 46 y.o. female who presents for colonoscopy for colon cancer screening. Denies blood in stools, changes in bowel habits, or weight loss. Denies family history of colon cancer.  Past Medical History:  Diagnosis Date   ALLERGIC RHINITIS 04/27/2010   Anxiety    on meds   Asthma    uses PRN inhaler   Headache(784.0) 04/27/2010   INSOMNIA, CHRONIC 04/27/2010   PCOS (polycystic ovarian syndrome)    diagnosed by Dr Everett Graff   Vitamin D deficiency    diagnosed by Dr Everett Graff (OB/GYN)    History reviewed. No pertinent surgical history.  Prior to Admission medications   Medication Sig Start Date End Date Taking? Authorizing Provider  ALPRAZolam Duanne Moron) 0.5 MG tablet Take 1-2 tablets (0.5-1 mg total) by mouth at bedtime as needed. 12/21/21  Yes Martinique, Betty G, MD  venlafaxine XR (EFFEXOR-XR) 37.5 MG 24 hr capsule Take 37.5 mg by mouth daily. 01/29/22  Yes [provider]  albuterol (VENTOLIN HFA) 108 (90 Base) MCG/ACT inhaler Inhale 2 puffs into the lungs every 4 (four) hours as needed. 06/15/19   Burchette, Alinda Sierras, MD    Current Outpatient Medications  Medication Sig Dispense Refill   ALPRAZolam (XANAX) 0.5 MG tablet Take 1-2 tablets (0.5-1 mg total) by mouth at bedtime as needed. 30 tablet 0   venlafaxine XR (EFFEXOR-XR) 37.5 MG 24 hr capsule Take 37.5 mg by mouth daily.     albuterol (VENTOLIN HFA) 108 (90 Base) MCG/ACT inhaler Inhale 2  puffs into the lungs every 4 (four) hours as needed. 6.7 g 2   Current Facility-Administered Medications  Medication Dose Route Frequency Provider Last Rate Last Admin   0.9 %  sodium chloride infusion  500 mL Intravenous Once Sharyn Creamer, MD        Allergies as of 05/29/2022 - Review Complete 05/29/2022  Allergen Reaction Noted   Mold extract  [trichophyton]     Molds & smuts  11/17/2018    Family History  Problem Relation Age of Onset   Hyperlipidemia Mother    Hypertension Mother    Deep vein thrombosis Mother    Lupus Mother 45   Esophageal cancer Father 95       agent orange exposure/etoh/smoker   Hyperlipidemia Father    Hypertension Father    Heart disease Brother    Breast cancer Maternal Aunt    Diabetes Maternal Grandmother    Diabetes Paternal Grandmother     Social History   Socioeconomic History   Marital status: Married    Spouse name: Not on file   Number of children: Not on file   Years of education: Not on file   Highest education level: Not on file  Occupational History    Employer: [employer]    Comment: Area Manager  Tobacco Use   Smoking status: Former    Types: Cigarettes    Quit date: 10/12/2004    Years since quitting: 17.6   Smokeless  tobacco: Never  Vaping Use   Vaping Use: Every day   Substances: Nicotine  Substance and Sexual Activity   Alcohol use: Yes    Alcohol/week: 2.0 - 4.0 standard drinks of alcohol    Types: 2 - 4 Standard drinks or equivalent per week   Drug use: No   Sexual activity: Not on file  Other Topics Concern   Not on file  Social History Narrative   Not on file   Social Determinants of Health   Financial Resource Strain: Not on file  Food Insecurity: Not on file  Transportation Needs: Not on file  Physical Activity: Not on file  Stress: Not on file  Social Connections: Not on file  Intimate Partner Violence: Not on file    Physical Exam: Vital signs in last 24 hours: BP 91/60   Pulse 71    Temp (!) 97.3 F (36.3 C)   Ht '5\' 6"'$  (1.676 m)   Wt 187 lb (84.8 kg)   SpO2 100%   BMI 30.18 kg/m  GEN: NAD EYE: Sclerae anicteric ENT: MMM CV: Non-tachycardic Pulm: No increased work of breathing GI: Soft, NT/ND NEURO:  Alert & Oriented   Christia Reading, MD [facility]  05/29/2022 9:39 AM

## 2022-05-29 NOTE — Patient Instructions (Signed)
Read all of the handouts given to you by your recovery room nurse.  YOU HAD AN ENDOSCOPIC PROCEDURE TODAY AT [facility]:   Refer to the procedure report that was given to you for any specific questions about what was found during the examination.  If the procedure report does not answer your questions, please call your gastroenterologist to clarify.  If you requested that your care partner not be given the details of your procedure findings, then the procedure report has been included in a sealed envelope for you to review at your convenience later.  YOU SHOULD EXPECT: Some feelings of bloating in the abdomen. Passage of more gas than usual.  Walking can help get rid of the air that was put into your GI tract during the procedure and reduce the bloating. If you had a lower endoscopy (such as a colonoscopy or flexible sigmoidoscopy) you may notice spotting of blood in your stool or on the toilet paper. If you underwent a bowel prep for your procedure, you may not have a normal bowel movement for a few days.  Please Note:  You might notice some irritation and congestion in your nose or some drainage.  This is from the oxygen used during your procedure.  There is no need for concern and it should clear up in a day or so.  SYMPTOMS TO REPORT IMMEDIATELY:  Following lower endoscopy (colonoscopy or flexible sigmoidoscopy):  Excessive amounts of blood in the stool  Significant tenderness or worsening of abdominal pains  Swelling of the abdomen that is new, acute  Fever of 100F or higher   For urgent or emergent issues, a gastroenterologist can be reached at any hour by calling [phone]. Do not use MyChart messaging for urgent concerns.    DIET:  We do recommend a small meal at first, but then you may proceed to your regular diet.  Drink plenty of fluids but you should avoid alcoholic beverages for 24 hours. Try to eat plenty of fiber, and drink water.  ACTIVITY:  You should  plan to take it easy for the rest of today and you should NOT DRIVE or use heavy machinery until tomorrow (because of the sedation medicines used during the test).    FOLLOW UP: Our staff will call the number listed on your records the next business day following your procedure.  We will call around 7:15- 8:00 am to check on you and address any questions or concerns that you may have regarding the information given to you following your procedure. If we do not reach you, we will leave a message.     If any biopsies were taken you will be contacted by phone or by letter within the next 1-3 weeks.  Please call us at [phone] if you have not heard about the biopsies in 3 weeks.    SIGNATURES/CONFIDENTIALITY: You and/or your care partner have signed paperwork which will be entered into your electronic medical record.  These signatures attest to the fact that that the information above on your After Visit Summary has been reviewed and is understood.  Full responsibility of the confidentiality of this discharge information lies with you and/or your care-partner.

## 2022-05-29 NOTE — Op Note (Signed)
[facility] Patient Name: Orit Sanville Procedure Date: 05/29/2022 10:29 AM MRN: [id number] Endoscopist: Sonny Masters "Christia Reading ,  Age: 46 Referring MD:  Date of Birth: [birth year] Gender: Female Account #: [id number] Procedure:                Colonoscopy Indications:              Screening for colorectal malignant neoplasm, This                            is the patient's first colonoscopy Medicines:                Monitored Anesthesia Care Procedure:                Pre-Anesthesia Assessment:                           - Prior to the procedure, a History and Physical                            was performed, and patient medications and                            allergies were reviewed. The patient's tolerance of                            previous anesthesia was also reviewed. The risks                            and benefits of the procedure and the sedation                            options and risks were discussed with the patient.                            All questions were answered, and informed consent                            was obtained. Prior Anticoagulants: The patient has                            taken no previous anticoagulant or antiplatelet                            agents. ASA Grade Assessment: II - A patient with                            mild systemic disease. After reviewing the risks                            and benefits, the patient was deemed in                            satisfactory condition to undergo the procedure.  After obtaining informed consent, the colonoscope                            was passed under direct vision. Throughout the                            procedure, the patient's blood pressure, pulse, and                            oxygen saturations were monitored continuously. The                            CF [id number] #[id number] was introduced through the anus                            and advanced to  the the terminal ileum. The                            colonoscopy was performed without difficulty. The                            patient tolerated the procedure well. The quality                            of the bowel preparation was good. The terminal                            ileum, ileocecal valve, appendiceal orifice, and                            rectum were photographed. Scope In: 10:35:24 AM Scope Out: 10:54:06 AM Scope Withdrawal Time: 0 hours 13 minutes 35 seconds  Total Procedure Duration: 0 hours 18 minutes 42 seconds  Findings:                 The terminal ileum appeared normal.                           A 4 mm polyp was found in the cecum. The polyp was                            sessile. The polyp was removed with a cold snare.                            Resection and retrieval were complete.                           A few diverticula were found in the sigmoid colon.                           A 5 mm polyp was found in the sigmoid colon. The                            polyp was  sessile. The polyp was removed with a                            cold snare. Resection and retrieval were complete.                           Non-bleeding internal hemorrhoids were found during                            retroflexion. Complications:            No immediate complications. Estimated Blood Loss:     Estimated blood loss was minimal. Impression:               - The examined portion of the ileum was normal.                           - One 4 mm polyp in the cecum, removed with a cold                            snare. Resected and retrieved.                           - Diverticulosis in the sigmoid colon.                           - One 5 mm polyp in the sigmoid colon, removed with                            a cold snare. Resected and retrieved.                           - Non-bleeding internal hemorrhoids. Recommendation:           - Discharge patient to home (with escort).                            - Await pathology results.                           - The findings and recommendations were discussed                            with the patient. Dr Georgian Co "Lyndee Leo" Lorenso Courier,  05/29/2022 11:01:13 AM

## 2022-05-29 NOTE — Progress Notes (Signed)
Pt's states no medical or surgical changes since previsit or office visit. 

## 2022-05-29 NOTE — Progress Notes (Signed)
1047 Ephedrine 10 mg given IV due to low BP, MD updated.

## 2022-05-29 NOTE — Progress Notes (Signed)
1055 Report given to PACU, vss

## 2022-05-30 ENCOUNTER — Telehealth: Payer: Self-pay | Admitting: *Deleted

## 2022-05-30 NOTE — Telephone Encounter (Signed)
  Follow up Call-     05/29/2022    9:13 AM  Call back number  Post procedure Call Back phone  # [phone]  Permission to leave phone message Yes     Patient questions:  Do you have a fever, pain , or abdominal swelling? No. Pain Score  0 *  Have you tolerated food without any problems? Yes.    Have you been able to return to your normal activities? Yes.    Do you have any questions about your discharge instructions: Diet   No. Medications  No. Follow up visit  No.  Do you have questions or concerns about your Care? No.  Actions: * If pain score is 4 or above: No action needed, pain <4.

## 2022-05-31 ENCOUNTER — Encounter: Payer: Self-pay | Admitting: Internal Medicine

## 2022-06-21 ENCOUNTER — Other Ambulatory Visit: Payer: Self-pay | Admitting: Family Medicine

## 2022-06-24 MED ORDER — ALPRAZOLAM 0.5 MG PO TABS: 0.5000 mg | ORAL_TABLET | Freq: Every evening | ORAL | 0 refills | Status: DC | PRN

## 2022-10-18 ENCOUNTER — Ambulatory Visit (INDEPENDENT_AMBULATORY_CARE_PROVIDER_SITE_OTHER): Payer: No Typology Code available for payment source | Admitting: Family Medicine

## 2022-10-18 ENCOUNTER — Encounter: Payer: Self-pay | Admitting: Family Medicine

## 2022-10-18 VITALS — BP 120/76 | HR 75 | Temp 98.4°F | Ht 66.0 in | Wt 196.5 lb

## 2022-10-18 DIAGNOSIS — S29019A Strain of muscle and tendon of unspecified wall of thorax, initial encounter: Secondary | ICD-10-CM

## 2022-10-18 MED ORDER — METHOCARBAMOL 500 MG PO TABS: 500.0000 mg | ORAL_TABLET | Freq: Three times a day (TID) | ORAL | 0 refills | Status: AC | PRN

## 2022-10-18 NOTE — Progress Notes (Signed)
Established Patient Office Visit  Subjective   Patient ID: Sarah Munoz, female    DOB: 1976/06/03  Age: 47 y.o. MRN: [id number]  Chief Complaint  Patient presents with   Back Pain    Patient complains of back pain, x2 days, Tried Tylenol and over the counter pain patches     HPI   Sarah Munoz is seen with midthoracic back pain which started couple days ago.  She first noted pain when she was getting ready for work and thinks she may have strained something.  Sudden onset.  Sharp quality of pain.  Worse with movement.  Has had a couple of occurrences of pain since then.  No spinal pain but this is more lateral on the left side.  She tried several things conservatively including heat, lidocaine patch, Tylenol, and topical balm with mild improvement.  She had some leftover Robaxin which she took at night.  She is requesting refill.  Denies any appetite or weight change.  No fever.  No cough.  No pleuritic pain.  No dyspnea.  No dysuria.  Past Medical History:  Diagnosis Date   ALLERGIC RHINITIS 04/27/2010   Anxiety    on meds   Asthma    uses PRN inhaler   Headache(784.0) 04/27/2010   INSOMNIA, CHRONIC 04/27/2010   PCOS (polycystic ovarian syndrome)    diagnosed by Dr Everett Graff   Vitamin D deficiency    diagnosed by Dr Everett Graff (OB/GYN)   History reviewed. No pertinent surgical history.  reports that she quit smoking about 18 years ago. Her smoking use included cigarettes. She has never used smokeless tobacco. She reports current alcohol use of about 2.0 - 4.0 standard drinks of alcohol per week. She reports that she does not use drugs. family history includes Breast cancer in her maternal aunt; Deep vein thrombosis in her mother; Diabetes in her maternal grandmother and paternal grandmother; Esophageal cancer (age of onset: 45) in her father; Heart disease in her brother; Hyperlipidemia in her father and mother; Hypertension in her father and mother; Lupus (age of onset:  11) in her mother. Allergies  Allergen Reactions   Mold Extract  [Trichophyton]    Molds & Smuts     Review of Systems  Constitutional:  Negative for chills and fever.  Respiratory:  Negative for cough and shortness of breath.   Cardiovascular:  Negative for chest pain.  Genitourinary:  Negative for dysuria.  Musculoskeletal:  Positive for back pain.      Objective:     BP 120/76 (BP Location: Left Arm, Patient Position: Sitting, Cuff Size: Normal)   Pulse 75   Temp 98.4 F (36.9 C) (Oral)   Ht 5' 6"$  (1.676 m)   Wt 196 lb 8 oz (89.1 kg)   SpO2 99%   BMI 31.72 kg/m    Physical Exam Vitals reviewed.  Constitutional:      Appearance: Normal appearance.  Cardiovascular:     Rate and Rhythm: Normal rate and regular rhythm.  Musculoskeletal:     Comments: No spinal tenderness.  Mild tenderness left mid thoracic area several centimeters lateral to the spine.  Neurological:     Mental Status: She is alert.      No results found for any visits on 10/18/22.    The ASCVD Risk score (Arnett DK, et al., 2019) failed to calculate for the following reasons:   The valid HDL cholesterol range is 20 to 100 mg/dL   Unable to determine if patient is  Non-Hispanic African American    Assessment & Plan:   Problem List Items Addressed This Visit   None Visit Diagnoses     Thoracic myofascial strain, initial encounter    -  Primary     -Continue conservative treatment with heat and/or ice, topical balm's, over-the-counter Advil or Aleve.  Prescription for Robaxin 500 mg every 8 hours as needed.  She is aware of potential sedation with this.  Follow-up for any persistent or worsening symptoms  No follow-ups on file.    Carolann Littler, MD

## 2023-01-05 ENCOUNTER — Other Ambulatory Visit: Payer: Self-pay | Admitting: Family Medicine

## 2023-01-07 ENCOUNTER — Other Ambulatory Visit: Payer: Self-pay | Admitting: Family Medicine

## 2023-01-07 DIAGNOSIS — Z1231 Encounter for screening mammogram for malignant neoplasm of breast: Secondary | ICD-10-CM

## 2023-01-07 MED ORDER — ALPRAZOLAM 0.5 MG PO TABS: 0.5000 mg | ORAL_TABLET | Freq: Every evening | ORAL | 0 refills | Status: DC | PRN

## 2023-02-11 ENCOUNTER — Ambulatory Visit: Payer: No Typology Code available for payment source

## 2023-02-27 ENCOUNTER — Ambulatory Visit
Admission: RE | Admit: 2023-02-27 | Discharge: 2023-02-27 | Disposition: A | Discharge status: Home / Self Care | Payer: No Typology Code available for payment source | Source: Ambulatory Visit | Attending: Family Medicine | Admitting: Family Medicine

## 2023-02-27 DIAGNOSIS — Z1231 Encounter for screening mammogram for malignant neoplasm of breast: Secondary | ICD-10-CM

## 2023-05-22 ENCOUNTER — Other Ambulatory Visit: Payer: Self-pay | Admitting: Family Medicine

## 2023-05-25 MED ORDER — ALPRAZOLAM 0.5 MG PO TABS: 0.5000 mg | ORAL_TABLET | Freq: Every evening | ORAL | 0 refills | Status: DC | PRN

## 2024-04-15 ENCOUNTER — Other Ambulatory Visit: Payer: Self-pay | Admitting: Family Medicine

## 2024-04-15 NOTE — Telephone Encounter (Signed)
 Needs office follow up.  Not seen > one year.  One refill provided  Wolm LELON Scarlet MD [facility]
